# Patient Record
Sex: Female | Born: 1955
Health system: Southern US, Community
[De-identification: ages and names within clinical notes are randomized; demographics above are authoritative.]

---

## 1998-10-05 ENCOUNTER — Other Ambulatory Visit: Admission: RE | Admit: 1998-10-05 | Discharge: 1998-10-05 | Payer: Self-pay | Admitting: Nurse Practitioner

## 2001-10-26 ENCOUNTER — Other Ambulatory Visit: Admission: RE | Admit: 2001-10-26 | Discharge: 2001-10-26 | Payer: Self-pay | Admitting: Obstetrics & Gynecology

## 2002-05-01 ENCOUNTER — Encounter: Payer: Self-pay | Admitting: Internal Medicine

## 2002-05-01 ENCOUNTER — Encounter: Admission: RE | Admit: 2002-05-01 | Discharge: 2002-05-01 | Payer: Self-pay | Admitting: Internal Medicine

## 2002-05-06 ENCOUNTER — Other Ambulatory Visit: Admission: RE | Admit: 2002-05-06 | Discharge: 2002-05-06 | Payer: Self-pay | Admitting: Internal Medicine

## 2003-01-09 ENCOUNTER — Other Ambulatory Visit: Admission: RE | Admit: 2003-01-09 | Discharge: 2003-01-09 | Payer: Self-pay | Admitting: Obstetrics & Gynecology

## 2004-03-31 ENCOUNTER — Other Ambulatory Visit: Admission: RE | Admit: 2004-03-31 | Discharge: 2004-03-31 | Payer: Self-pay | Admitting: Obstetrics & Gynecology

## 2004-05-26 ENCOUNTER — Ambulatory Visit: Payer: Self-pay | Admitting: Internal Medicine

## 2004-07-22 ENCOUNTER — Ambulatory Visit: Payer: Self-pay | Admitting: Internal Medicine

## 2005-09-21 ENCOUNTER — Ambulatory Visit: Payer: Self-pay | Admitting: Internal Medicine

## 2005-09-27 ENCOUNTER — Encounter: Payer: Self-pay | Admitting: Internal Medicine

## 2005-09-27 ENCOUNTER — Ambulatory Visit: Payer: Self-pay | Admitting: Internal Medicine

## 2005-09-27 ENCOUNTER — Other Ambulatory Visit: Admission: RE | Admit: 2005-09-27 | Discharge: 2005-09-27 | Payer: Self-pay | Admitting: Internal Medicine

## 2005-10-03 ENCOUNTER — Encounter: Admission: RE | Admit: 2005-10-03 | Discharge: 2005-10-03 | Payer: Self-pay | Admitting: Internal Medicine

## 2006-10-26 ENCOUNTER — Ambulatory Visit: Payer: Self-pay | Admitting: Internal Medicine

## 2006-10-26 LAB — CONVERTED CEMR LAB
AST: 30 units/L (ref 0–37)
Albumin: 3.8 g/dL (ref 3.5–5.2)
Bacteria, UA: NEGATIVE
Basophils Absolute: 0 10*3/uL (ref 0.0–0.1)
Bilirubin Urine: NEGATIVE
Chloride: 106 meq/L (ref 96–112)
Cholesterol: 182 mg/dL (ref 0–200)
GFR calc non Af Amer: 70 mL/min
HDL: 77.1 mg/dL (ref >39.0)
LDL Cholesterol: 97 mg/dL (ref 0–99)
MCHC: 35.4 g/dL (ref 30.0–36.0)
Monocytes Relative: 6.1 % (ref 3.0–11.0)
Nitrite: NEGATIVE
Platelets: 201 10*3/uL (ref 150–400)
RBC: 3.94 M/uL (ref 3.87–5.11)
RDW: 13.5 % (ref 11.5–14.6)
Sodium: 142 meq/L (ref 135–145)
Specific Gravity, Urine: 1.015 (ref 1.000–1.03)
TSH: 6.24 microintl units/mL — ABNORMAL HIGH (ref 0.35–5.50)
Total CHOL/HDL Ratio: 2.4
pH: 7 (ref 5.0–8.0)

## 2006-11-02 ENCOUNTER — Ambulatory Visit: Payer: Self-pay | Admitting: Internal Medicine

## 2006-11-02 ENCOUNTER — Other Ambulatory Visit: Admission: RE | Admit: 2006-11-02 | Discharge: 2006-11-02 | Payer: Self-pay | Admitting: Internal Medicine

## 2006-11-02 ENCOUNTER — Encounter: Payer: Self-pay | Admitting: Internal Medicine

## 2006-12-06 ENCOUNTER — Encounter: Admission: RE | Admit: 2006-12-06 | Discharge: 2006-12-06 | Payer: Self-pay | Admitting: Internal Medicine

## 2007-02-06 ENCOUNTER — Encounter: Payer: Self-pay | Admitting: *Deleted

## 2007-02-06 DIAGNOSIS — E039 Hypothyroidism, unspecified: Secondary | ICD-10-CM | POA: Insufficient documentation

## 2007-02-06 DIAGNOSIS — E538 Deficiency of other specified B group vitamins: Secondary | ICD-10-CM | POA: Insufficient documentation

## 2008-01-04 ENCOUNTER — Ambulatory Visit: Payer: Self-pay | Admitting: Internal Medicine

## 2008-01-04 ENCOUNTER — Telehealth: Payer: Self-pay | Admitting: Internal Medicine

## 2008-01-09 DIAGNOSIS — D649 Anemia, unspecified: Secondary | ICD-10-CM | POA: Insufficient documentation

## 2008-02-19 ENCOUNTER — Ambulatory Visit: Payer: Self-pay | Admitting: Internal Medicine

## 2008-02-19 LAB — CONVERTED CEMR LAB
Albumin: 3.7 g/dL (ref 3.5–5.2)
Alkaline Phosphatase: 98 units/L (ref 39–117)
BUN: 13 mg/dL (ref 6–23)
Bilirubin Urine: NEGATIVE
Bilirubin, Direct: 0.2 mg/dL (ref 0.0–0.3)
Calcium: 9.4 mg/dL (ref 8.4–10.5)
Cholesterol: 158 mg/dL (ref 0–200)
Eosinophils Absolute: 0.2 10*3/uL (ref 0.0–0.7)
GFR calc Af Amer: 97 mL/min
GFR calc non Af Amer: 80 mL/min
HCT: 39.3 % (ref 36.0–46.0)
HDL: 74.3 mg/dL (ref >39.0)
Ketones, ur: NEGATIVE mg/dL
MCV: 96.3 fL (ref 78.0–100.0)
Monocytes Absolute: 0.4 10*3/uL (ref 0.1–1.0)
Neutro Abs: 2.1 10*3/uL (ref 1.4–7.7)
Platelets: 220 10*3/uL (ref 150–400)
Potassium: 4.2 meq/L (ref 3.5–5.1)
RDW: 12.4 % (ref 11.5–14.6)
Sodium: 141 meq/L (ref 135–145)
Total Protein, Urine: NEGATIVE mg/dL
Triglycerides: 44 mg/dL (ref 0–149)
Urine Glucose: NEGATIVE mg/dL
VLDL: 9 mg/dL (ref 0–40)

## 2008-02-26 ENCOUNTER — Ambulatory Visit: Payer: Self-pay | Admitting: Internal Medicine

## 2008-02-29 ENCOUNTER — Encounter: Admission: RE | Admit: 2008-02-29 | Discharge: 2008-02-29 | Payer: Self-pay | Admitting: Internal Medicine

## 2008-11-18 ENCOUNTER — Ambulatory Visit: Payer: Self-pay | Admitting: Internal Medicine

## 2008-11-18 DIAGNOSIS — J069 Acute upper respiratory infection, unspecified: Secondary | ICD-10-CM | POA: Insufficient documentation

## 2008-11-18 DIAGNOSIS — L719 Rosacea, unspecified: Secondary | ICD-10-CM | POA: Insufficient documentation

## 2008-11-25 ENCOUNTER — Telehealth: Payer: Self-pay | Admitting: Internal Medicine

## 2008-11-27 ENCOUNTER — Ambulatory Visit: Payer: Self-pay | Admitting: Internal Medicine

## 2008-11-27 DIAGNOSIS — R091 Pleurisy: Secondary | ICD-10-CM | POA: Insufficient documentation

## 2008-11-27 DIAGNOSIS — J180 Bronchopneumonia, unspecified organism: Secondary | ICD-10-CM | POA: Insufficient documentation

## 2009-03-18 ENCOUNTER — Ambulatory Visit: Payer: Self-pay | Admitting: Internal Medicine

## 2009-03-19 LAB — CONVERTED CEMR LAB
Alkaline Phosphatase: 96 units/L (ref 39–117)
BUN: 13 mg/dL (ref 6–23)
Basophils Absolute: 0 10*3/uL (ref 0.0–0.1)
Basophils Relative: 0.6 % (ref 0.0–3.0)
Bilirubin, Direct: 0.1 mg/dL (ref 0.0–0.3)
CO2: 28 meq/L (ref 19–32)
Calcium: 9.3 mg/dL (ref 8.4–10.5)
Chloride: 108 meq/L (ref 96–112)
Cholesterol: 161 mg/dL (ref 0–200)
Creatinine, Ser: 0.9 mg/dL (ref 0.4–1.2)
Eosinophils Absolute: 0.1 10*3/uL (ref 0.0–0.7)
HDL: 72.1 mg/dL (ref >39.00)
Lymphocytes Relative: 45.2 % (ref 12.0–46.0)
MCHC: 34.6 g/dL (ref 30.0–36.0)
MCV: 95.8 fL (ref 78.0–100.0)
Monocytes Absolute: 0.3 10*3/uL (ref 0.1–1.0)
Neutrophils Relative %: 46 % (ref 43.0–77.0)
Nitrite: NEGATIVE
Platelets: 205 10*3/uL (ref 150.0–400.0)
RBC: 4.11 M/uL (ref 3.87–5.11)
RDW: 12.9 % (ref 11.5–14.6)
Specific Gravity, Urine: 1.02 (ref 1.000–1.030)
Total Bilirubin: 0.5 mg/dL (ref 0.3–1.2)
Total CHOL/HDL Ratio: 2
Total Protein, Urine: NEGATIVE mg/dL
Total Protein: 6.8 g/dL (ref 6.0–8.3)
Triglycerides: 49 mg/dL (ref 0.0–149.0)
Urine Glucose: NEGATIVE mg/dL
Vit D, 25-Hydroxy: 24 ng/mL — ABNORMAL LOW (ref 30–89)
pH: 5.5 (ref 5.0–8.0)

## 2009-03-25 ENCOUNTER — Other Ambulatory Visit: Admission: RE | Admit: 2009-03-25 | Discharge: 2009-03-25 | Payer: Self-pay | Admitting: Internal Medicine

## 2009-03-25 ENCOUNTER — Ambulatory Visit: Payer: Self-pay | Admitting: Internal Medicine

## 2009-03-25 ENCOUNTER — Encounter: Payer: Self-pay | Admitting: Internal Medicine

## 2009-03-25 DIAGNOSIS — J029 Acute pharyngitis, unspecified: Secondary | ICD-10-CM | POA: Insufficient documentation

## 2009-03-25 DIAGNOSIS — N760 Acute vaginitis: Secondary | ICD-10-CM | POA: Insufficient documentation

## 2009-03-25 DIAGNOSIS — L259 Unspecified contact dermatitis, unspecified cause: Secondary | ICD-10-CM | POA: Insufficient documentation

## 2009-03-25 DIAGNOSIS — D259 Leiomyoma of uterus, unspecified: Secondary | ICD-10-CM | POA: Insufficient documentation

## 2009-03-27 ENCOUNTER — Encounter: Admission: RE | Admit: 2009-03-27 | Discharge: 2009-03-27 | Payer: Self-pay | Admitting: Internal Medicine

## 2009-05-06 ENCOUNTER — Telehealth: Payer: Self-pay | Admitting: Internal Medicine

## 2009-08-10 ENCOUNTER — Encounter: Payer: Self-pay | Admitting: Internal Medicine

## 2010-04-20 ENCOUNTER — Encounter: Admission: RE | Admit: 2010-04-20 | Discharge: 2010-04-20 | Payer: Self-pay | Admitting: Internal Medicine

## 2010-06-01 ENCOUNTER — Encounter
Admission: RE | Admit: 2010-06-01 | Discharge: 2010-06-01 | Payer: Self-pay | Source: Home / Self Care | Attending: Internal Medicine | Admitting: Internal Medicine

## 2010-06-29 NOTE — Letter (Signed)
Summary: Referral - not able to see patient  [facility]  [address] [place], [place] [address]   Phone: [phone]  Fax: [phone]    August 10, 2009   Jacinta Shoe, M.D. [address] [place], [place] [address]   Re:   KAITLEN REDFORD DOB:  1955-06-12 MRN:   [id number]    Dear Dr. Posey Rea:  Thank you for your kind referral of the above patient.  We have attempted to schedule the recommended procedure Screening Colonoscopy but have not been able to schedule because:   X  The patient was not available by phone and/or has not returned our calls.  ___ The patient declined to schedule the procedure at this time.  We appreciate the referral and hope that we will have the opportunity to treat this patient in the future.    Sincerely,    [facility] [phone]     Appended Document: Referral - not able to see patient noted

## 2010-06-30 ENCOUNTER — Encounter: Payer: Self-pay | Admitting: Internal Medicine

## 2010-07-27 ENCOUNTER — Telehealth: Payer: Self-pay | Admitting: Internal Medicine

## 2010-08-05 NOTE — Progress Notes (Signed)
Summary: LAST OV 2010  Phone Note Refill Request Message from:  Pharmacy  Refills Requested: Medication #1:  SYNTHROID 88 MCG  TABS once daily Initial call taken by: Lamar Sprinkles, CMA,  July 27, 2010 7:07 PM  Follow-up for Phone Call        ok to fill x6 months  sch ov pls Follow-up by: Tresa Garter MD,  July 28, 2010 7:52 AM    Prescriptions: SYNTHROID 88 MCG  TABS (LEVOTHYROXINE SODIUM) once daily  #30 x 5   Entered by:   Lamar Sprinkles, CMA   Authorized by:   Tresa Garter MD   Signed by:   Lamar Sprinkles, CMA on 07/28/2010   Method used:   Electronically to        [facility]  [address] [id number]* (retail)       [address]       [place], [place]  [address]       Ph: [phone] or [phone]       Fax: [phone]   RxID:   [id number]

## 2010-10-12 NOTE — Assessment & Plan Note (Signed)
[facility]                           PRIMARY CARE OFFICE NOTE   NAME:Mikayla Spears, Mikayla Spears                       MRN:          [id number]  DATE:11/02/2006                            DOB:          [birth year]    The patient is a 55 year old female who presents for a wellness  examination and pap smear.  Past medical history, family history, and  social history as per 09/27/05 note.   ALLERGIES:  CODEINE and SULFA.   MEDICATIONS:  She ran out of birth control pills in January 2008, never  started back.  1. Calcium with vitamin D.  2. Synthroid 88 mcg.   REVIEW OF SYSTEMS:  Last menstrual period was four months ago. She saw  Dr. Arlyce Dice six weeks ago complaining of the absence of her period. She  was told that she is menopausal, does reports some night sweats. She is  exercising several times a week at the gym. No chest pain or shortness  of breath. No syncope. No neurologic complaints. The rest of the 18  point review of systems is negative.   PHYSICAL EXAMINATION:  VITAL SIGNS:  Blood pressure 122/81, pulse 56,  temperature 97, weight 180 pounds.  GENERAL:  She is in no acute distress and looks well.  HEENT:  Moist mucosa.  NECK:  Supple. No thyromegaly or bruit.  LUNGS:  Clear no wheezes or rales.  HEART:  S1, S2, no murmur, no gallop.  ABDOMEN:  Soft and nontender, no organomegaly or mass felt.  LOWER EXTREMITIES:  Without edema.  NEUROLOGIC:  She is alert, oriented and cooperative. Denies being  depressed.  BREAST:  Reveals symmetric nipples, normal breasts without masses.  No  axillary lymphadenopathy  Normal external genitalia, cervix visualized with normal appearance. Pap  smear obtained.  Bimanual examination is normal.  RECTAL:  Normal. Stool is guaiac negative.   LABORATORY DATA:  EKG with normal sinus rhythm, demonstrates sinus  bradycardia. On 10/26/06, CBC normal, glucose 100, cholesterol 182, LDL  97, HDL 77, urinalysis normal. TSH  slightly elevated.   ASSESSMENT AND PLAN:  1. Normal wellness examination.  Age-related health issues discussed,      lifestyle discussed. She will take calcium with vitamin D, bone      density scan ordered, will obtain chest x-ray next year.  2. Sweats likely due to menopause. As I understand,  she has had FSH      done with Dr. Arlyce Dice. She will follow up with him.  3. Hypothyroidism. Slightly elevated TSH, likely caused by the fact      that she ran out of      Synthroid for a couple of weeks.  4. B12 deficiency. We will recheck vitamin B12 level.     [name], MD  Electronically Signed    AVP/MedQ  DD: 11/05/2006  DT: 11/06/2006  Job #: [id number]   cc:   Ilda Mori, M.D.

## 2011-06-06 ENCOUNTER — Other Ambulatory Visit: Payer: Self-pay | Admitting: Internal Medicine

## 2011-06-06 DIAGNOSIS — Z1231 Encounter for screening mammogram for malignant neoplasm of breast: Secondary | ICD-10-CM

## 2011-06-22 ENCOUNTER — Ambulatory Visit: Payer: Self-pay

## 2011-06-27 ENCOUNTER — Ambulatory Visit
Admission: RE | Admit: 2011-06-27 | Discharge: 2011-06-27 | Disposition: A | Discharge status: Home / Self Care | Payer: BC Managed Care – PPO | Source: Ambulatory Visit | Attending: Internal Medicine | Admitting: Internal Medicine

## 2011-06-27 DIAGNOSIS — Z1231 Encounter for screening mammogram for malignant neoplasm of breast: Secondary | ICD-10-CM

## 2012-06-26 ENCOUNTER — Other Ambulatory Visit: Payer: Self-pay | Admitting: Internal Medicine

## 2012-06-26 DIAGNOSIS — Z1231 Encounter for screening mammogram for malignant neoplasm of breast: Secondary | ICD-10-CM

## 2012-07-11 ENCOUNTER — Ambulatory Visit
Admission: RE | Admit: 2012-07-11 | Discharge: 2012-07-11 | Disposition: A | Discharge status: Home / Self Care | Payer: BC Managed Care – PPO | Source: Ambulatory Visit | Attending: Internal Medicine | Admitting: Internal Medicine

## 2012-07-11 ENCOUNTER — Ambulatory Visit: Payer: BC Managed Care – PPO

## 2012-07-11 DIAGNOSIS — Z1231 Encounter for screening mammogram for malignant neoplasm of breast: Secondary | ICD-10-CM

## 2013-07-08 ENCOUNTER — Other Ambulatory Visit: Payer: Self-pay

## 2013-07-08 DIAGNOSIS — Z1231 Encounter for screening mammogram for malignant neoplasm of breast: Secondary | ICD-10-CM

## 2013-07-24 ENCOUNTER — Ambulatory Visit
Admission: RE | Admit: 2013-07-24 | Discharge: 2013-07-24 | Disposition: A | Discharge status: Home / Self Care | Payer: BC Managed Care – PPO | Source: Ambulatory Visit

## 2013-07-24 DIAGNOSIS — Z1231 Encounter for screening mammogram for malignant neoplasm of breast: Secondary | ICD-10-CM

## 2014-07-11 ENCOUNTER — Other Ambulatory Visit: Payer: Self-pay

## 2014-07-11 DIAGNOSIS — Z1231 Encounter for screening mammogram for malignant neoplasm of breast: Secondary | ICD-10-CM

## 2014-07-25 ENCOUNTER — Ambulatory Visit
Admission: RE | Admit: 2014-07-25 | Discharge: 2014-07-25 | Disposition: A | Discharge status: Home / Self Care | Payer: BLUE CROSS/BLUE SHIELD | Source: Ambulatory Visit

## 2014-07-25 ENCOUNTER — Encounter (INDEPENDENT_AMBULATORY_CARE_PROVIDER_SITE_OTHER): Payer: Self-pay

## 2014-07-25 DIAGNOSIS — Z1231 Encounter for screening mammogram for malignant neoplasm of breast: Secondary | ICD-10-CM

## 2015-07-22 ENCOUNTER — Other Ambulatory Visit: Payer: Self-pay

## 2015-07-22 DIAGNOSIS — Z1231 Encounter for screening mammogram for malignant neoplasm of breast: Secondary | ICD-10-CM

## 2015-08-03 ENCOUNTER — Ambulatory Visit
Admission: RE | Admit: 2015-08-03 | Discharge: 2015-08-03 | Disposition: A | Discharge status: Home / Self Care | Payer: 59 | Source: Ambulatory Visit

## 2015-08-03 DIAGNOSIS — Z1231 Encounter for screening mammogram for malignant neoplasm of breast: Secondary | ICD-10-CM

## 2015-08-27 ENCOUNTER — Other Ambulatory Visit: Payer: Self-pay | Admitting: Obstetrics & Gynecology

## 2015-08-28 LAB — CYTOLOGY - PAP

## 2016-06-01 ENCOUNTER — Other Ambulatory Visit: Payer: Self-pay | Admitting: Gastroenterology

## 2016-06-24 DIAGNOSIS — J101 Influenza due to other identified influenza virus with other respiratory manifestations: Secondary | ICD-10-CM | POA: Diagnosis not present

## 2016-06-30 ENCOUNTER — Other Ambulatory Visit: Payer: Self-pay | Admitting: Internal Medicine

## 2016-06-30 DIAGNOSIS — Z1231 Encounter for screening mammogram for malignant neoplasm of breast: Secondary | ICD-10-CM

## 2016-07-12 DIAGNOSIS — H52223 Regular astigmatism, bilateral: Secondary | ICD-10-CM | POA: Diagnosis not present

## 2016-07-12 DIAGNOSIS — H5203 Hypermetropia, bilateral: Secondary | ICD-10-CM | POA: Diagnosis not present

## 2016-07-12 DIAGNOSIS — H25813 Combined forms of age-related cataract, bilateral: Secondary | ICD-10-CM | POA: Diagnosis not present

## 2016-07-12 DIAGNOSIS — H40023 Open angle with borderline findings, high risk, bilateral: Secondary | ICD-10-CM | POA: Diagnosis not present

## 2016-07-12 DIAGNOSIS — H524 Presbyopia: Secondary | ICD-10-CM | POA: Diagnosis not present

## 2016-07-29 ENCOUNTER — Ambulatory Visit (HOSPITAL_COMMUNITY)
Admission: RE | Admit: 2016-07-29 | Discharge: 2016-07-29 | Disposition: A | Discharge status: Home / Self Care | Payer: BLUE CROSS/BLUE SHIELD | Source: Ambulatory Visit | Attending: Gastroenterology | Admitting: Gastroenterology

## 2016-07-29 ENCOUNTER — Ambulatory Visit (HOSPITAL_COMMUNITY): Payer: BLUE CROSS/BLUE SHIELD | Admitting: Anesthesiology

## 2016-07-29 ENCOUNTER — Encounter (HOSPITAL_COMMUNITY): Payer: Self-pay | Admitting: *Deleted

## 2016-07-29 ENCOUNTER — Encounter (HOSPITAL_COMMUNITY)
Admission: RE | Disposition: A | Discharge status: Home / Self Care | Payer: Self-pay | Source: Ambulatory Visit | Attending: Gastroenterology

## 2016-07-29 DIAGNOSIS — Z882 Allergy status to sulfonamides status: Secondary | ICD-10-CM | POA: Insufficient documentation

## 2016-07-29 DIAGNOSIS — K317 Polyp of stomach and duodenum: Secondary | ICD-10-CM | POA: Diagnosis not present

## 2016-07-29 DIAGNOSIS — K2950 Unspecified chronic gastritis without bleeding: Secondary | ICD-10-CM | POA: Insufficient documentation

## 2016-07-29 DIAGNOSIS — K2940 Chronic atrophic gastritis without bleeding: Secondary | ICD-10-CM | POA: Diagnosis not present

## 2016-07-29 DIAGNOSIS — E039 Hypothyroidism, unspecified: Secondary | ICD-10-CM | POA: Insufficient documentation

## 2016-07-29 DIAGNOSIS — D3A092 Benign carcinoid tumor of the stomach: Secondary | ICD-10-CM | POA: Diagnosis not present

## 2016-07-29 DIAGNOSIS — D649 Anemia, unspecified: Secondary | ICD-10-CM | POA: Diagnosis not present

## 2016-07-29 HISTORY — PX: EUS: SHX5427

## 2016-07-29 SURGERY — UPPER ENDOSCOPIC ULTRASOUND (EUS) LINEAR
Anesthesia: Monitor Anesthesia Care

## 2016-07-29 MED ORDER — LIDOCAINE 2% (20 MG/ML) 5 ML SYRINGE
INTRAMUSCULAR | Status: AC
Start: 2016-07-29 — End: 2016-07-29
  Filled 2016-07-29: qty 5

## 2016-07-29 MED ORDER — SODIUM CHLORIDE 0.9 % IV SOLN: INTRAVENOUS | Status: DC

## 2016-07-29 MED ORDER — PROPOFOL 10 MG/ML IV BOLUS
INTRAVENOUS | Status: AC
  Filled 2016-07-29: qty 20

## 2016-07-29 MED ORDER — LIDOCAINE 2% (20 MG/ML) 5 ML SYRINGE
INTRAMUSCULAR | Status: DC | PRN
  Administered 2016-07-29: 100 mg via INTRAVENOUS

## 2016-07-29 MED ORDER — PROPOFOL 10 MG/ML IV BOLUS
INTRAVENOUS | Status: DC | PRN
  Administered 2016-07-29: 30 mg via INTRAVENOUS
  Administered 2016-07-29 (×4): 50 mg via INTRAVENOUS

## 2016-07-29 MED ORDER — LACTATED RINGERS IV SOLN
INTRAVENOUS | Status: DC
  Administered 2016-07-29: 1000 mL via INTRAVENOUS

## 2016-07-29 NOTE — Op Note (Signed)
[facility] Patient Name: Mikayla Spears Procedure Date: 07/29/2016 MRN: [id number] Attending MD: Carol Ada , MD Date of Birth: 1955-12-23 CSN: [id number] Age: 61 Admit Type: Outpatient Procedure:                Upper EUS Indications:              Gastric mucosal mass/polyp found on endoscopy Providers:                Carol Ada, MD, Laverta Baltimore RN, RN, Cherylynn Ridges, Technician, [facility], CRNA Referring MD:              Medicines:                Propofol per Anesthesia Complications:            No immediate complications. Estimated Blood Loss:     Estimated blood loss was minimal. Procedure:                Pre-Anesthesia Assessment:                           - Prior to the procedure, a History and Physical                            was performed, and patient medications and                            allergies were reviewed. The patient's tolerance of                            previous anesthesia was also reviewed. The risks                            and benefits of the procedure and the sedation                            options and risks were discussed with the patient.                            All questions were answered, and informed consent                            was obtained. Prior Anticoagulants: The patient has                            taken no previous anticoagulant or antiplatelet                            agents. ASA Grade Assessment: II - A patient with                            mild systemic disease. After reviewing the risks  and benefits, the patient was deemed in                            satisfactory condition to undergo the procedure.                           - Sedation was administered by an anesthesia                            professional. Deep sedation was attained.                           After obtaining informed consent, the endoscope was                             passed under direct vision. Throughout the                            procedure, the patient's blood pressure, pulse, and                            oxygen saturations were monitored continuously. The                            [id number]) scope was introduced through                            the mouth, and advanced to the second part of                            duodenum. The [id number]) scope was                            introduced through the mouth, and advanced to the                            second part of duodenum. The upper EUS was                            accomplished without difficulty. The patient                            tolerated the procedure well. Scope In: Scope Out: Findings:      Endoscopic Finding :      The examined esophagus was endoscopically normal.      Four 3 to 5 mm sessile polyps with no bleeding and no stigmata of recent       bleeding were found in the cardia and in the gastric fundus. The polyp       was removed with a hot snare and The polyp was removed with a cold       snare. Resection and retrieval were complete.      The examined duodenum was endoscopically normal.      Endosonographic Finding :      Visualized portions of the left lobe of  the liver, left adrenal gland,       left kidney and pancreas were normal on ultrasound examination. No       abnormal-appearing lymph nodes were identified in the abdomen.      The gastric mucosa exhibited an atrophic gastritis. In the fundus a       couple of small sessile polypoid lesions were identified and they       correlated with the prior findings of the index EGD. Since the pathology       was significant for a gastric carcinoid, these two lesions were removed       with a hot snare. At the cardia, just below the GE junction, two other       sessile lesions were identified. It is uncertain if she has a multifocal       gastric carcinoid and a cold snare was used to  removed these two       lesions. I opted for a cold snare at the histology was unknown and it       was in close proximity to the GE junction. Impression:               - Normal esophagus.                           - Four gastric polyps. Resected and retrieved.                           - Normal examined duodenum. Moderate Sedation:      N/A- Per Anesthesia Care Recommendation:           - Patient has a contact number available for                            emergencies. The signs and symptoms of potential                            delayed complications were discussed with the                            patient. Return to normal activities tomorrow.                            Written discharge instructions were provided to the                            patient.                           - Await biopsy results.                           - Resume previous diet. Procedure Code(s):        --- Professional ---                           [phone], Esophagogastroduodenoscopy, flexible,                            transoral; with removal of tumor(s), polyp(s),  or                            other lesion(s) by snare technique                           43237, Esophagogastroduodenoscopy, flexible,                            transoral; with endoscopic ultrasound examination                            limited to the esophagus, stomach or duodenum, and                            adjacent structures Diagnosis Code(s):        --- Professional ---                           K31.7, Polyp of stomach and duodenum                           K31.89, Other diseases of stomach and duodenum CPT copyright 2016 American Medical Association. All rights reserved. The codes documented in this report are preliminary and upon coder review may  be revised to meet current compliance requirements. Carol Ada, MD Carol Ada, MD 07/29/2016 9:37:57 AM This report has been signed electronically. Number of Addenda: 0

## 2016-07-29 NOTE — Progress Notes (Signed)
Pt preop heart rate 65, immediately postop  In 50's . bp normal see chart. HR now in 40's. Pt without symptoms. bp still normal. Dr. Kalman Shan made aware of HR in 40's. No new orders and fine with pts status to discharge

## 2016-07-29 NOTE — Progress Notes (Signed)
Dr. Kalman Shan called regarding two bp 206/73. And 37-40's. No new orders pt denies any symprtoms and does not feel bad.  Dr. Kalman Shan feels fine to discharge her home. Told pt to follow up and have bp checked at home. And if she gets any symptoms headache dizzness or feels bad to call doctor or come in

## 2016-07-29 NOTE — Discharge Instructions (Signed)

## 2016-07-29 NOTE — Transfer of Care (Signed)
Immediate Anesthesia Transfer of Care Note  Patient: Mikayla Spears  Procedure(s) Performed: Procedure(s): UPPER ENDOSCOPIC ULTRASOUND (EUS) LINEAR (N/A)  Patient Location: PACU  Anesthesia Type:MAC  Level of Consciousness: awake, alert  and oriented  Airway & Oxygen Therapy: Patient Spontanous Breathing  Post-op Assessment: Report given to RN and Post -op Vital signs reviewed and stable  Post vital signs: Reviewed and stable  Last Vitals:  Vitals:   07/29/16 0804  BP: (!) 163/85  Pulse: 65  Resp: 20  Temp: 36.8 C    Last Pain:  Vitals:   07/29/16 0804  TempSrc: Oral         Complications: No apparent anesthesia complications

## 2016-07-29 NOTE — Anesthesia Postprocedure Evaluation (Signed)
Anesthesia Post Note  Patient: Mikayla Spears  Procedure(s) Performed: Procedure(s) (LRB): UPPER ENDOSCOPIC ULTRASOUND (EUS) LINEAR (N/A)  Patient location during evaluation: PACU Anesthesia Type: MAC Level of consciousness: awake and alert Pain management: pain level controlled Vital Signs Assessment: post-procedure vital signs reviewed and stable Respiratory status: spontaneous breathing, nonlabored ventilation, respiratory function stable and patient connected to nasal cannula oxygen Cardiovascular status: stable and blood pressure returned to baseline Anesthetic complications: no       Last Vitals:  Vitals:   07/29/16 0955 07/29/16 1000  BP:    Pulse: (!) 51 (!) 48  Resp: 11 19  Temp:      Last Pain:  Vitals:   07/29/16 0937  TempSrc: Oral                 Gordy Goar S

## 2016-07-29 NOTE — Anesthesia Preprocedure Evaluation (Signed)
Anesthesia Evaluation  Patient identified by MRN, date of birth, ID band Patient awake    Reviewed: Allergy & Precautions, NPO status , Patient's Chart, lab work & pertinent test results  Airway Mallampati: II  TM Distance: >3 FB Neck ROM: Full    Dental no notable dental hx.    Pulmonary neg pulmonary ROS,    Pulmonary exam normal breath sounds clear to auscultation       Cardiovascular negative cardio ROS Normal cardiovascular exam Rhythm:Regular Rate:Normal     Neuro/Psych negative neurological ROS  negative psych ROS   GI/Hepatic negative GI ROS, Neg liver ROS,   Endo/Other  Hypothyroidism   Renal/GU negative Renal ROS  negative genitourinary   Musculoskeletal negative musculoskeletal ROS (+)   Abdominal   Peds negative pediatric ROS (+)  Hematology negative hematology ROS (+)   Anesthesia Other Findings   Reproductive/Obstetrics negative OB ROS                             Anesthesia Physical Anesthesia Plan  ASA: II  Anesthesia Plan: MAC   Post-op Pain Management:    Induction: Intravenous  Airway Management Planned: Nasal Cannula  Additional Equipment:   Intra-op Plan:   Post-operative Plan:   Informed Consent: I have reviewed the patients History and Physical, chart, labs and discussed the procedure including the risks, benefits and alternatives for the proposed anesthesia with the patient or authorized representative who has indicated his/her understanding and acceptance.   Dental advisory given  Plan Discussed with: CRNA and Surgeon  Anesthesia Plan Comments:         Anesthesia Quick Evaluation

## 2016-07-29 NOTE — H&P (Addendum)
Toni Amend HPI: This is a 61 year old female with a findings of a small gastric carcinoid in the fundus.  She is here for further evaluation and follow up.  The EGD was performed on 12/09/2015.  No past medical history on file.  No past surgical history on file.  No family history on file.  Social History:  has no tobacco, alcohol, and drug history on file.  Allergies:  Allergies  Allergen Reactions  . Doxycycline     REACTION: rash  and elev LFT  (?)  . Codeine Hives, Swelling and Rash  . Sulfonamide Derivatives Hives, Swelling and Rash    Medications:  Scheduled:  Continuous: . sodium chloride      No results found for this or any previous visit (from the past 24 hour(s)).   No results found.  ROS:  As stated above in the HPI otherwise negative.  There were no vitals taken for this visit.    PE: Gen: NAD, Alert and Oriented HEENT:  [place]/AT, EOMI Neck: Supple, no LAD Lungs: CTA Bilaterally CV: RRR without M/G/R ABM: Soft, NTND, +BS Ext: No C/C/E  Assessment/Plan: 1) Gastric carcinoid - EUS with repeat biopsies.  Terrick Allred D 07/29/2016, 7:57 AM

## 2016-08-01 ENCOUNTER — Encounter (HOSPITAL_COMMUNITY): Payer: Self-pay | Admitting: Gastroenterology

## 2016-08-16 ENCOUNTER — Ambulatory Visit
Admission: RE | Admit: 2016-08-16 | Discharge: 2016-08-16 | Disposition: A | Discharge status: Home / Self Care | Payer: BLUE CROSS/BLUE SHIELD | Source: Ambulatory Visit | Attending: Internal Medicine | Admitting: Internal Medicine

## 2016-08-16 DIAGNOSIS — Z1231 Encounter for screening mammogram for malignant neoplasm of breast: Secondary | ICD-10-CM

## 2016-10-05 DIAGNOSIS — Z0000 Encounter for general adult medical examination without abnormal findings: Secondary | ICD-10-CM | POA: Diagnosis not present

## 2016-10-12 DIAGNOSIS — E039 Hypothyroidism, unspecified: Secondary | ICD-10-CM | POA: Diagnosis not present

## 2016-10-12 DIAGNOSIS — L719 Rosacea, unspecified: Secondary | ICD-10-CM | POA: Diagnosis not present

## 2016-10-12 DIAGNOSIS — Z131 Encounter for screening for diabetes mellitus: Secondary | ICD-10-CM | POA: Diagnosis not present

## 2016-10-12 DIAGNOSIS — Z0000 Encounter for general adult medical examination without abnormal findings: Secondary | ICD-10-CM | POA: Diagnosis not present

## 2016-10-12 DIAGNOSIS — M8589 Other specified disorders of bone density and structure, multiple sites: Secondary | ICD-10-CM | POA: Diagnosis not present

## 2016-10-12 DIAGNOSIS — R002 Palpitations: Secondary | ICD-10-CM | POA: Diagnosis not present

## 2016-10-31 NOTE — Anesthesia Postprocedure Evaluation (Signed)
Anesthesia Post Note  Patient: Mikayla Spears  Procedure(s) Performed: Procedure(s) (LRB): UPPER ENDOSCOPIC ULTRASOUND (EUS) LINEAR (N/A)     Anesthesia Post Evaluation  Last Vitals:  Vitals:   07/29/16 1035 07/29/16 1040  BP:    Pulse: (!) 49 (!) 42  Resp: 14 (!) 9  Temp:      Last Pain:  Vitals:   07/29/16 0937  TempSrc: Oral                 [name] S

## 2016-10-31 NOTE — Addendum Note (Signed)
Addendum  created 10/31/16 1151 by Myrtie Soman, MD   Sign clinical note

## 2017-02-14 DIAGNOSIS — M5416 Radiculopathy, lumbar region: Secondary | ICD-10-CM | POA: Diagnosis not present

## 2017-03-14 DIAGNOSIS — J069 Acute upper respiratory infection, unspecified: Secondary | ICD-10-CM | POA: Diagnosis not present

## 2017-03-16 DIAGNOSIS — M4726 Other spondylosis with radiculopathy, lumbar region: Secondary | ICD-10-CM | POA: Diagnosis not present

## 2017-03-16 DIAGNOSIS — M79671 Pain in right foot: Secondary | ICD-10-CM | POA: Diagnosis not present

## 2017-03-16 DIAGNOSIS — M549 Dorsalgia, unspecified: Secondary | ICD-10-CM | POA: Diagnosis not present

## 2017-03-22 ENCOUNTER — Ambulatory Visit: Payer: BLUE CROSS/BLUE SHIELD | Attending: Orthopaedic Surgery | Admitting: Physical Therapy

## 2017-03-22 ENCOUNTER — Encounter: Payer: Self-pay | Admitting: Physical Therapy

## 2017-03-22 DIAGNOSIS — M79671 Pain in right foot: Secondary | ICD-10-CM

## 2017-03-22 DIAGNOSIS — R262 Difficulty in walking, not elsewhere classified: Secondary | ICD-10-CM | POA: Diagnosis not present

## 2017-03-22 NOTE — Therapy (Signed)
[facility] [address] [place], [place], [address] Phone: [phone]   Fax:  [phone]  Physical Therapy Evaluation  Patient Details  Name: Mikayla Spears MRN: [id number] Date of Birth: March 24, 1956 Referring Provider: Patrice Paradise  Encounter Date: 03/22/2017      PT End of Session - 03/22/17 1544    Visit Number 1   Date for PT Re-Evaluation 05/22/17   PT Start Time 1435   PT Stop Time 1530   PT Time Calculation (min) 55 min   Activity Tolerance Patient tolerated treatment well   Behavior During Therapy [facility] for tasks assessed/performed      History reviewed. No pertinent past medical history.  Past Surgical History:  Procedure Laterality Date  . EUS N/A 07/29/2016   Procedure: UPPER ENDOSCOPIC ULTRASOUND (EUS) LINEAR;  Surgeon: Carol Ada, MD;  Location: [facility];  Service: Endoscopy;  Laterality: N/A;    There were no vitals filed for this visit.       Subjective Assessment - 03/22/17 1442    Subjective Patient reports that about a month ago she had some back pain, then that overall has felt better but now has numbness and pain in the right heel.   Limitations Lifting;Walking   Patient Stated Goals have less pain, walk easier   Currently in Pain? Yes   Pain Score 6    Pain Location Heel   Pain Orientation Right   Pain Descriptors / Indicators Aching;Numbness   Pain Type Acute pain   Pain Onset More than a month ago   Pain Frequency Intermittent   Aggravating Factors  pain in the heel is mostly with DF, sharp pain in the plantar and medial aspect of the heel   Pain Relieving Factors at rest and off of feet the pain can be 0/10            [facility] PT Assessment - 03/22/17 0001      Assessment   Medical Diagnosis right heel pain   Referring Provider Cohen   Onset Date/Surgical Date 02/20/17   Prior Therapy no     Precautions   Precautions None     Balance Screen   Has the patient fallen in  the past 6 months No   Has the patient had a decrease in activity level because of a fear of falling?  No   Is the patient reluctant to leave their home because of a fear of falling?  No     Home Environment   Additional Comments has stairs, does the cooking, some cleaning, cares for grandkids at times     Prior Function   Level of Independence Independent   Vocation Retired   Leisure no exercise     ROM / Strength   AROM / PROM / Strength AROM;Strength     AROM   Overall AROM Comments lumbar ROM was WFL's, but reports that has some right low back pain, anle ROM WFL's except DF is 5 degrees iwth pain inthe heel     Strength   Overall Strength Comments WNL's for LE's and ankle     Flexibility   Soft Tissue Assessment /Muscle Length --  very tight in the calves and plantar fascia     Palpation   Palpation comment mild tendernessin the right SI, she is very tender in the right plantar fascia and the plantar and medial heel     Ambulation/Gait   Gait Comments mild antalgic gait on the  right with decreased toe off            Objective measurements completed on examination: See above findings.          [facility] PT Treatment/Exercise - 03/22/17 0001      Modalities   Modalities Electrical Stimulation;Moist Heat;Ultrasound                PT Education - 03/22/17 1544    Education provided Yes   Education Details gave HEP for calf and plantar flexibility   Person(s) Educated Patient   Methods Explanation;Demonstration;Tactile cues;Verbal cues;Handout   Comprehension Verbalized understanding;Returned demonstration          PT Short Term Goals - 03/22/17 1548      PT SHORT TERM GOAL #1   Title reports 50% less pain   Time 8   Period Weeks   Status New     PT SHORT TERM GOAL #2   Title increase DF to WNL's   Time 8   Period Weeks   Status New     PT SHORT TERM GOAL #3   Title report no problems walking up and down stairs   Time 8   Period  Weeks   Status New                   Plan - 03/22/17 1545    Clinical Impression Statement Patient reports that her issue started with back pain but now she has mostly right foot and heel pain with some numbness.  Lumbar ROM was WFL's, right ankle ROM was WNL's except DF was to 5 degrees with pain int he heel with this.  Any active DF and toe extension seemed to make this pain worse.  She had mild tenderness in the right SI area, she was very tender in the right plantar fascia and the heel.   Clinical Presentation Stable   Clinical Decision Making Low   Rehab Potential Good   PT Frequency 2x / week   PT Duration 8 weeks   PT Treatment/Interventions ADLs/Self Care Home Management;Cryotherapy;Electrical Stimulation;Iontophoresis 4mg /ml Dexamethasone;Moist Heat;Ultrasound;Gait training;Stair training;Functional mobility training;Patient/family education;Balance training;Therapeutic exercise;Therapeutic activities;Manual techniques;Taping   PT Next Visit Plan could try any modalities for plantar fascitis, will continue to look at back, assure her ability to do the HEP for calf and foot flexibility, sh has a high co insurance   Consulted and Agree with Plan of Care Patient      Patient will benefit from skilled therapeutic intervention in order to improve the following deficits and impairments:  Abnormal gait, Decreased balance, Postural dysfunction, Improper body mechanics, Impaired flexibility, Pain, Increased muscle spasms, Difficulty walking, Decreased range of motion  Visit Diagnosis: Pain in right foot - Plan: PT plan of care cert/re-cert  Difficulty in walking, not elsewhere classified - Plan: PT plan of care cert/re-cert      G-Codes - 17/49/44 1601    Functional Assessment Tool Used (Outpatient Only) foto 44% limitation   Functional Limitation Mobility: Walking and moving around   Mobility: Walking and Moving Around Current Status [phone]) At least 40 percent but less than  60 percent impaired, limited or restricted   Mobility: Walking and Moving Around Goal Status [phone]) At least 20 percent but less than 40 percent impaired, limited or restricted       Problem List Patient Active Problem List   Diagnosis Date Noted  . FIBROIDS, UTERUS 03/25/2009  . SORE THROAT 03/25/2009  . VAGINITIS 03/25/2009  . DERMATITIS, ALLERGIC  03/25/2009  . BRONCHOPNEUMONIA 11/27/2008  . PLEURISY 11/27/2008  . UPPER RESPIRATORY INFECTION (URI) 11/18/2008  . ROSACEA 11/18/2008  . ANEMIA-NOS 01/09/2008  . HYPOTHYROIDISM 02/06/2007  . VITAMIN B12 DEFICIENCY 02/06/2007    Sumner Boast., PT 03/22/2017, 4:03 PM  [address] [place], [place], [address] Phone: [phone]   Fax:  [phone]  Name: Mikayla Spears MRN: [id number] Date of Birth: 09/16/1955

## 2017-03-29 ENCOUNTER — Ambulatory Visit: Payer: BLUE CROSS/BLUE SHIELD | Admitting: Physical Therapy

## 2017-03-29 ENCOUNTER — Encounter: Payer: Self-pay | Admitting: Physical Therapy

## 2017-03-29 DIAGNOSIS — R262 Difficulty in walking, not elsewhere classified: Secondary | ICD-10-CM | POA: Diagnosis not present

## 2017-03-29 DIAGNOSIS — M79671 Pain in right foot: Secondary | ICD-10-CM | POA: Diagnosis not present

## 2017-03-29 NOTE — Therapy (Signed)
[facility] [place], [place], [address] Phone: [phone]   Fax:  [phone]  Physical Therapy Treatment  Patient Details  Name: Mikayla Spears MRN: [id number] Date of Birth: 08-May-1956 Referring Provider: Patrice Paradise  Encounter Date: 03/29/2017      PT End of Session - 03/29/17 1426    Visit Number 2   PT Start Time 8756   PT Stop Time 1435   PT Time Calculation (min) 50 min   Activity Tolerance Patient tolerated treatment well   Behavior During Therapy [facility] for tasks assessed/performed      History reviewed. No pertinent past medical history.  Past Surgical History:  Procedure Laterality Date  . EUS N/A 07/29/2016   Procedure: UPPER ENDOSCOPIC ULTRASOUND (EUS) LINEAR;  Surgeon: Carol Ada, MD;  Location: [facility];  Service: Endoscopy;  Laterality: N/A;    There were no vitals filed for this visit.      Subjective Assessment - 03/29/17 1349    Subjective Pt reports that her biggest issue is her foot, Her back bothers her after prolong standing   Currently in Pain? No/denies   Pain Score 0-No pain                         [facility] Adult PT Treatment/Exercise - 03/29/17 0001      Exercises   Exercises Knee/Hip;Ankle;Lumbar     Lumbar Exercises: Machines for Strengthening   Other Lumbar Machine Exercise Rows & lats 20lb 2x10     Lumbar Exercises: Standing   Shoulder Extension 10 reps;Theraband;Both  x2   Theraband Level (Shoulder Extension) Level 2 (Red)     Knee/Hip Exercises: Aerobic   Recumbent Bike L1 x 6 min     Knee/Hip Exercises: Machines for Strengthening   Cybex Knee Flexion 20lb 2x15     Modalities   Modalities Cryotherapy     Cryotherapy   Number Minutes Cryotherapy 10 Minutes   Cryotherapy Location Ankle   Type of Cryotherapy Ice pack     Manual Therapy   Manual Therapy Passive ROM;Soft tissue mobilization   Manual therapy comments Some PROM taken to  end range and held.   Soft tissue mobilization Plantar area R foot, some tenderness in arch with palpation.   Passive ROM R ankle all directions     Ankle Exercises: Stretches   Gastroc Stretch 10 seconds;3 reps     Ankle Exercises: Seated   Other Seated Ankle Exercises 4 way ankle Tband x15 each                  PT Short Term Goals - 03/22/17 1548      PT SHORT TERM GOAL #1   Title reports 50% less pain   Time 8   Period Weeks   Status New     PT SHORT TERM GOAL #2   Title increase DF to WNL's   Time 8   Period Weeks   Status New     PT SHORT TERM GOAL #3   Title report no problems walking up and down stairs   Time 8   Period Weeks   Status New                  Plan - 03/29/17 1427    Clinical Impression Statement No issues reported with today's exercises. She does display some ankle weakness with ankle PRE's. Reports some pain in the arch of  her R food with palpation.    Rehab Potential Good   PT Frequency 2x / week   PT Duration 8 weeks   PT Treatment/Interventions ADLs/Self Care Home Management;Cryotherapy;Electrical Stimulation;Iontophoresis 4mg /ml Dexamethasone;Moist Heat;Ultrasound;Gait training;Stair training;Functional mobility training;Patient/family education;Balance training;Therapeutic exercise;Therapeutic activities;Manual techniques;Taping   PT Next Visit Plan could try any modalities for plantar fascitis, will continue to look at back, assure her ability to do the HEP for calf and foot flexibility, sh has a high co insurance      Patient will benefit from skilled therapeutic intervention in order to improve the following deficits and impairments:  Abnormal gait, Decreased balance, Postural dysfunction, Improper body mechanics, Impaired flexibility, Pain, Increased muscle spasms, Difficulty walking, Decreased range of motion  Visit Diagnosis: Pain in right foot  Difficulty in walking, not elsewhere classified     Problem  List Patient Active Problem List   Diagnosis Date Noted  . FIBROIDS, UTERUS 03/25/2009  . SORE THROAT 03/25/2009  . VAGINITIS 03/25/2009  . DERMATITIS, ALLERGIC 03/25/2009  . BRONCHOPNEUMONIA 11/27/2008  . PLEURISY 11/27/2008  . UPPER RESPIRATORY INFECTION (URI) 11/18/2008  . ROSACEA 11/18/2008  . ANEMIA-NOS 01/09/2008  . HYPOTHYROIDISM 02/06/2007  . VITAMIN B12 DEFICIENCY 02/06/2007    Scot Jun, PTA 03/29/2017, 2:28 PM  [facility] [place], [place], [address] Phone: [phone]   Fax:  [phone]  Name: Mikayla Spears MRN: [id number] Date of Birth: [birth year]

## 2017-04-05 ENCOUNTER — Ambulatory Visit: Payer: BLUE CROSS/BLUE SHIELD | Admitting: Physical Therapy

## 2017-04-07 ENCOUNTER — Ambulatory Visit: Payer: BLUE CROSS/BLUE SHIELD | Attending: Orthopaedic Surgery | Admitting: Physical Therapy

## 2017-04-07 DIAGNOSIS — M79671 Pain in right foot: Secondary | ICD-10-CM

## 2017-04-07 DIAGNOSIS — R262 Difficulty in walking, not elsewhere classified: Secondary | ICD-10-CM | POA: Diagnosis not present

## 2017-04-07 NOTE — Therapy (Addendum)
[address] [place], [place], [address] Phone: [phone]   Fax:  [phone]  Physical Therapy Treatment  Patient Details  Name: Mikayla Spears MRN: [id number] Date of Birth: 1955/12/16 Referring Provider: Patrice Paradise   Encounter Date: 04/07/2017  PT End of Session - 04/07/17 1128    Visit Number  3    Date for PT Re-Evaluation  05/22/17    PT Start Time  1100    PT Stop Time  1145    PT Time Calculation (min)  45 min       No past medical history on file.  No past surgical history on file.  There were no vitals filed for this visit.  Subjective Assessment - 04/07/17 1058    Subjective  getting better    Currently in Pain?  Yes    Pain Score  4     Pain Location  Foot    Pain Orientation  Right         [facility] PT Assessment - 04/07/17 0001      AROM   Overall AROM Comments  DF 15                  [facility] Adult PT Treatment/Exercise - 04/07/17 0001      Ambulation/Gait   Stairs  Yes    Stairs Assistance  7: Independent    Stair Management Technique  Alternating pattern    Number of Stairs  16    Gait Comments  no deficiets on level terrain      Lumbar Exercises: Aerobic   Stationary Bike  Nustep L 4 6 min      Lumbar Exercises: Machines for Strengthening   Cybex Lumbar Extension  black tband 2 sets 10    Leg Press  30# 2 sets 10 calf raises 30# 2 sets 10    Other Lumbar Machine Exercise  Rows & lats 20lb 2x10      Lumbar Exercises: Standing   Other Standing Lumbar Exercises  pulleys retraction and ext 15 times each      Knee/Hip Exercises: Standing   Other Standing Knee Exercises  SLS on sit fit ankle ROM 4 way 15 each    Other Standing Knee Exercises  black bar DF 15/PF 15      Cryotherapy   Number Minutes Cryotherapy  15 Minutes    Cryotherapy Location  Ankle    Type of Cryotherapy  Ice pack      Electrical Stimulation   Electrical Stimulation Location  bottom RT  foot    Electrical Stimulation Action  IFC    Electrical Stimulation Goals  Pain      Manual Therapy   Passive ROM  no tightness in ankle or LE neg neural tension               PT Short Term Goals - 04/07/17 1100      PT SHORT TERM GOAL #1   Title  reports 50% less pain    Status  Partially Met      PT SHORT TERM GOAL #2   Title  increase DF to WNL's    Period  Days    Status  Achieved      PT SHORT TERM GOAL #3   Status  Achieved               Plan - 04/07/17 1128    Clinical Impression Statement  no issues  with steps and full ROM. 2/3 LTG met. Pain getting better- c/o symptoms with prolonged standing. No issues with ex but cuing for ex control and speed needed    PT Treatment/Interventions  ADLs/Self Care Home Management;Cryotherapy;Electrical Stimulation;Iontophoresis 68m/ml Dexamethasone;Moist Heat;Ultrasound;Gait training;Stair training;Functional mobility training;Patient/family education;Balance training;Therapeutic exercise;Therapeutic activities;Manual techniques;Taping    PT Next Visit Plan  assure appropraite gym ex and prepare for D/C       Patient will benefit from skilled therapeutic intervention in order to improve the following deficits and impairments:  Abnormal gait, Decreased balance, Postural dysfunction, Improper body mechanics, Impaired flexibility, Pain, Increased muscle spasms, Difficulty walking, Decreased range of motion  Visit Diagnosis: Pain in right foot  Difficulty in walking, not elsewhere classified     Problem List Patient Active Problem List   Diagnosis Date Noted  . FIBROIDS, UTERUS 03/25/2009  . SORE THROAT 03/25/2009  . VAGINITIS 03/25/2009  . DERMATITIS, ALLERGIC 03/25/2009  . BRONCHOPNEUMONIA 11/27/2008  . PLEURISY 11/27/2008  . UPPER RESPIRATORY INFECTION (URI) 11/18/2008  . ROSACEA 11/18/2008  . ANEMIA-NOS 01/09/2008  . HYPOTHYROIDISM 02/06/2007  . VITAMIN B12 DEFICIENCY 02/06/2007   PHYSICAL THERAPY  DISCHARGE SUMMARY   Plan: Patient agrees to discharge.  Patient goals were partially met. Patient is being discharged due to being pleased with the current functional level.  ?????      Arnol Mcgibbon,ANGIE PTA 04/07/2017, 11:32 AM  [address] [address]Phone: [phone]  Fax:  [phone] Name: Mikayla KLINGELMRN: [id number] of Birth: 612-04-1956

## 2017-05-12 DIAGNOSIS — H40023 Open angle with borderline findings, high risk, bilateral: Secondary | ICD-10-CM | POA: Diagnosis not present

## 2017-05-12 DIAGNOSIS — H25813 Combined forms of age-related cataract, bilateral: Secondary | ICD-10-CM | POA: Diagnosis not present

## 2017-06-05 DIAGNOSIS — H40003 Preglaucoma, unspecified, bilateral: Secondary | ICD-10-CM | POA: Diagnosis not present

## 2017-08-07 ENCOUNTER — Other Ambulatory Visit: Payer: Self-pay | Admitting: Internal Medicine

## 2017-08-07 DIAGNOSIS — Z1231 Encounter for screening mammogram for malignant neoplasm of breast: Secondary | ICD-10-CM

## 2017-08-22 DIAGNOSIS — D3A092 Benign carcinoid tumor of the stomach: Secondary | ICD-10-CM | POA: Diagnosis not present

## 2017-08-22 DIAGNOSIS — Z86010 Personal history of colonic polyps: Secondary | ICD-10-CM | POA: Diagnosis not present

## 2017-08-24 ENCOUNTER — Ambulatory Visit
Admission: RE | Admit: 2017-08-24 | Discharge: 2017-08-24 | Disposition: A | Discharge status: Home / Self Care | Payer: BLUE CROSS/BLUE SHIELD | Source: Ambulatory Visit | Attending: Internal Medicine | Admitting: Internal Medicine

## 2017-08-24 DIAGNOSIS — Z1231 Encounter for screening mammogram for malignant neoplasm of breast: Secondary | ICD-10-CM

## 2017-09-04 DIAGNOSIS — Z86010 Personal history of colonic polyps: Secondary | ICD-10-CM | POA: Diagnosis not present

## 2017-09-04 DIAGNOSIS — D371 Neoplasm of uncertain behavior of stomach: Secondary | ICD-10-CM | POA: Diagnosis not present

## 2017-09-06 ENCOUNTER — Other Ambulatory Visit: Payer: Self-pay | Admitting: Gastroenterology

## 2017-09-06 DIAGNOSIS — Z01419 Encounter for gynecological examination (general) (routine) without abnormal findings: Secondary | ICD-10-CM | POA: Diagnosis not present

## 2017-09-06 DIAGNOSIS — Z6823 Body mass index (BMI) 23.0-23.9, adult: Secondary | ICD-10-CM | POA: Diagnosis not present

## 2017-09-25 DIAGNOSIS — J329 Chronic sinusitis, unspecified: Secondary | ICD-10-CM | POA: Diagnosis not present

## 2017-09-25 DIAGNOSIS — E039 Hypothyroidism, unspecified: Secondary | ICD-10-CM | POA: Diagnosis not present

## 2017-10-25 DIAGNOSIS — Z131 Encounter for screening for diabetes mellitus: Secondary | ICD-10-CM | POA: Diagnosis not present

## 2017-10-25 DIAGNOSIS — Z0000 Encounter for general adult medical examination without abnormal findings: Secondary | ICD-10-CM | POA: Diagnosis not present

## 2017-10-25 DIAGNOSIS — E039 Hypothyroidism, unspecified: Secondary | ICD-10-CM | POA: Diagnosis not present

## 2017-11-02 DIAGNOSIS — Z0000 Encounter for general adult medical examination without abnormal findings: Secondary | ICD-10-CM | POA: Diagnosis not present

## 2017-11-02 DIAGNOSIS — E039 Hypothyroidism, unspecified: Secondary | ICD-10-CM | POA: Diagnosis not present

## 2017-11-17 ENCOUNTER — Other Ambulatory Visit: Payer: Self-pay

## 2017-11-17 ENCOUNTER — Ambulatory Visit (HOSPITAL_COMMUNITY)
Admission: RE | Admit: 2017-11-17 | Discharge: 2017-11-17 | Disposition: A | Discharge status: Home / Self Care | Payer: BLUE CROSS/BLUE SHIELD | Source: Ambulatory Visit | Attending: Gastroenterology | Admitting: Gastroenterology

## 2017-11-17 ENCOUNTER — Encounter (HOSPITAL_COMMUNITY)
Admission: RE | Disposition: A | Discharge status: Home / Self Care | Payer: Self-pay | Source: Ambulatory Visit | Attending: Gastroenterology

## 2017-11-17 ENCOUNTER — Encounter (HOSPITAL_COMMUNITY): Payer: Self-pay | Admitting: *Deleted

## 2017-11-17 ENCOUNTER — Ambulatory Visit (HOSPITAL_COMMUNITY): Payer: BLUE CROSS/BLUE SHIELD | Admitting: Anesthesiology

## 2017-11-17 DIAGNOSIS — K2950 Unspecified chronic gastritis without bleeding: Secondary | ICD-10-CM | POA: Diagnosis not present

## 2017-11-17 DIAGNOSIS — Z882 Allergy status to sulfonamides status: Secondary | ICD-10-CM | POA: Insufficient documentation

## 2017-11-17 DIAGNOSIS — Z881 Allergy status to other antibiotic agents status: Secondary | ICD-10-CM | POA: Diagnosis not present

## 2017-11-17 DIAGNOSIS — D3A092 Benign carcinoid tumor of the stomach: Secondary | ICD-10-CM | POA: Diagnosis not present

## 2017-11-17 DIAGNOSIS — Z85831 Personal history of malignant neoplasm of soft tissue: Secondary | ICD-10-CM | POA: Insufficient documentation

## 2017-11-17 DIAGNOSIS — Z885 Allergy status to narcotic agent status: Secondary | ICD-10-CM | POA: Diagnosis not present

## 2017-11-17 DIAGNOSIS — K2970 Gastritis, unspecified, without bleeding: Secondary | ICD-10-CM | POA: Diagnosis not present

## 2017-11-17 DIAGNOSIS — D259 Leiomyoma of uterus, unspecified: Secondary | ICD-10-CM | POA: Diagnosis not present

## 2017-11-17 DIAGNOSIS — E039 Hypothyroidism, unspecified: Secondary | ICD-10-CM | POA: Diagnosis not present

## 2017-11-17 DIAGNOSIS — K317 Polyp of stomach and duodenum: Secondary | ICD-10-CM | POA: Diagnosis not present

## 2017-11-17 HISTORY — PX: UPPER ESOPHAGEAL ENDOSCOPIC ULTRASOUND (EUS): SHX6562

## 2017-11-17 HISTORY — PX: POLYPECTOMY: SHX5525

## 2017-11-17 HISTORY — PX: ESOPHAGOGASTRODUODENOSCOPY (EGD) WITH PROPOFOL: SHX5813

## 2017-11-17 HISTORY — PX: BIOPSY: SHX5522

## 2017-11-17 SURGERY — ESOPHAGOGASTRODUODENOSCOPY (EGD) WITH PROPOFOL
Anesthesia: Monitor Anesthesia Care

## 2017-11-17 MED ORDER — PROPOFOL 10 MG/ML IV BOLUS
INTRAVENOUS | Status: AC
  Filled 2017-11-17: qty 60

## 2017-11-17 MED ORDER — PROPOFOL 500 MG/50ML IV EMUL
INTRAVENOUS | Status: DC | PRN
  Administered 2017-11-17: 100 ug/kg/min via INTRAVENOUS

## 2017-11-17 MED ORDER — SODIUM CHLORIDE 0.9 % IV SOLN: INTRAVENOUS | Status: DC

## 2017-11-17 MED ORDER — PROPOFOL 10 MG/ML IV BOLUS
INTRAVENOUS | Status: DC | PRN
  Administered 2017-11-17 (×2): 10 mg via INTRAVENOUS
  Administered 2017-11-17: 20 mg via INTRAVENOUS

## 2017-11-17 MED ORDER — LACTATED RINGERS IV SOLN
INTRAVENOUS | Status: DC
  Administered 2017-11-17: 1000 mL via INTRAVENOUS

## 2017-11-17 SURGICAL SUPPLY — 15 items

## 2017-11-17 NOTE — Anesthesia Preprocedure Evaluation (Signed)
Anesthesia Evaluation  Patient identified by MRN, date of birth, ID band Patient awake    Reviewed: Allergy & Precautions, NPO status , Patient's Chart, lab work & pertinent test results  Airway Mallampati: II  TM Distance: >3 FB Neck ROM: Full    Dental no notable dental hx. (+) Teeth Intact   Pulmonary neg pulmonary ROS,    Pulmonary exam normal breath sounds clear to auscultation       Cardiovascular negative cardio ROS Normal cardiovascular exam Rhythm:Regular Rate:Normal     Neuro/Psych negative neurological ROS  negative psych ROS   GI/Hepatic negative GI ROS, Neg liver ROS,   Endo/Other  Hypothyroidism   Renal/GU negative Renal ROS  negative genitourinary   Musculoskeletal negative musculoskeletal ROS (+)   Abdominal Normal abdominal exam  (+)   Peds negative pediatric ROS (+)  Hematology negative hematology ROS (+)   Anesthesia Other Findings   Reproductive/Obstetrics negative OB ROS                             Anesthesia Physical  Anesthesia Plan  ASA: II  Anesthesia Plan: MAC   Post-op Pain Management:    Induction: Intravenous  PONV Risk Score and Plan:   Airway Management Planned: Natural Airway and Mask  Additional Equipment:   Intra-op Plan:   Post-operative Plan:   Informed Consent: I have reviewed the patients History and Physical, chart, labs and discussed the procedure including the risks, benefits and alternatives for the proposed anesthesia with the patient or authorized representative who has indicated his/her understanding and acceptance.   Dental advisory given  Plan Discussed with: CRNA  Anesthesia Plan Comments:         Anesthesia Quick Evaluation

## 2017-11-17 NOTE — Anesthesia Postprocedure Evaluation (Signed)
Anesthesia Post Note  Patient: Mikayla Spears  Procedure(s) Performed: ESOPHAGOGASTRODUODENOSCOPY (EGD) WITH PROPOFOL (N/A ) UPPER ESOPHAGEAL ENDOSCOPIC ULTRASOUND (EUS) (N/A ) BIOPSY POLYPECTOMY     Patient location during evaluation: Endoscopy Anesthesia Type: MAC Level of consciousness: awake Pain management: pain level controlled Vital Signs Assessment: post-procedure vital signs reviewed and stable Respiratory status: spontaneous breathing Cardiovascular status: stable Postop Assessment: no apparent nausea or vomiting Anesthetic complications: no    Last Vitals:  Vitals:   11/17/17 0840 11/17/17 0900  BP: 114/66 (!) 149/79  Pulse: (!) 58 (!) 51  Resp: 16 12  Temp: 36.4 C   SpO2: 100% 100%    Last Pain:  Vitals:   11/17/17 0900  TempSrc:   PainSc: 0-No pain   Pain Goal:                 Azhane Eckart JR,JOHN Carthel [place]

## 2017-11-17 NOTE — Discharge Instructions (Signed)

## 2017-11-17 NOTE — H&P (Signed)
  Mikayla Spears HPI: This 62 year old [place] female presents to the office for a 1 year repeat EUS/ EGD for a low grade carcinoid of the stomach. She has 2 BM's per day with no obvious blood or mucus in the stool. She has good appetite and her weight has been stable. She denies having any complaints of abdominal pain, nausea, vomiting, acid reflux, dysphagia or odynophagia. She denies having a family history of colon cancer, celiac sprue or IBD. Her last colonoscopy was done on 12/09/2015 when fragments of tubular adenomas were removed from the hepatic flexure.   History reviewed. No pertinent past medical history.  Past Surgical History:  Procedure Laterality Date  . EUS N/A 07/29/2016   Procedure: UPPER ENDOSCOPIC ULTRASOUND (EUS) LINEAR;  Surgeon: Mikayla Ada, MD;  Location: [facility];  Service: Endoscopy;  Laterality: N/A;    History reviewed. No pertinent family history.  Social History:  reports that she has never smoked. She has never used smokeless tobacco. She reports that she does not drink alcohol or use drugs.  Allergies:  Allergies  Allergen Reactions  . Codeine Hives, Swelling and Rash  . Doxycycline Rash and Other (See Comments)    REACTION: elev LFT  (?)  . Sulfonamide Derivatives Hives, Swelling and Rash    Medications:  Scheduled:  Continuous: . sodium chloride    . lactated ringers 1,000 mL (11/17/17 0712)    No results found for this or any previous visit (from the past 24 hour(s)).   No results found.  ROS:  As stated above in the HPI otherwise negative.  Blood pressure (!) 192/76, pulse (!) 50, temperature 97.8 F (36.6 C), temperature source Oral, resp. rate 13, height 4\' 11"  (1.499 m), weight 50.8 kg (112 lb), SpO2 99 %.    PE: Gen: NAD, Alert and Oriented HEENT:  [place]/AT, EOMI Neck: Supple, no LAD Lungs: CTA Bilaterally CV: RRR without M/G/R ABM: Soft, NTND, +BS Ext: No C/C/E  Assessment/Plan: 1) History of gastric carcinoid -  EUS/EGD  Mikayla Spears D 11/17/2017, 8:01 AM

## 2017-11-17 NOTE — Transfer of Care (Signed)
Immediate Anesthesia Transfer of Care Note  Patient: Mikayla Spears  Procedure(s) Performed: ESOPHAGOGASTRODUODENOSCOPY (EGD) WITH PROPOFOL (N/A ) UPPER ESOPHAGEAL ENDOSCOPIC ULTRASOUND (EUS) (N/A ) BIOPSY  Patient Location: PACU  Anesthesia Type:MAC  Level of Consciousness: sedated  Airway & Oxygen Therapy: Patient Spontanous Breathing and Patient connected to nasal cannula oxygen  Post-op Assessment: Report given to RN and Post -op Vital signs reviewed and stable  Post vital signs: Reviewed and stable  Last Vitals:  Vitals Value Taken Time  BP 114/66 11/17/2017  8:41 AM  Temp    Pulse 58 11/17/2017  8:41 AM  Resp 18 11/17/2017  8:41 AM  SpO2 100 % 11/17/2017  8:41 AM  Vitals shown include unvalidated device data.  Last Pain:  Vitals:   11/17/17 0707  TempSrc: Oral  PainSc: 0-No pain         Complications: No apparent anesthesia complications

## 2017-11-17 NOTE — Op Note (Signed)
[facility] Patient Name: Mikayla Spears Procedure Date: 11/17/2017 MRN: [id number] Attending MD: Carol Ada , MD Date of Birth: Oct 16, 1955 CSN: [id number] Age: 62 Admit Type: Outpatient Procedure:                Upper EUS Indications:              History of gastric carcinoid Providers:                Carol Ada, MD, Tory Emerald, RN, William Dalton, Technician Referring MD:              Medicines:                Propofol per Anesthesia Complications:            No immediate complications. Estimated Blood Loss:     Estimated blood loss was minimal. Procedure:                Pre-Anesthesia Assessment:                           - Prior to the procedure, a History and Physical                            was performed, and patient medications and                            allergies were reviewed. The patient's tolerance of                            previous anesthesia was also reviewed. The risks                            and benefits of the procedure and the sedation                            options and risks were discussed with the patient.                            All questions were answered, and informed consent                            was obtained. Prior Anticoagulants: The patient has                            taken no previous anticoagulant or antiplatelet                            agents. ASA Grade Assessment: II - A patient with                            mild systemic disease. After reviewing the risks  and benefits, the patient was deemed in                            satisfactory condition to undergo the procedure.                           - Sedation was administered by an anesthesia                            professional. Deep sedation was attained.                           After obtaining informed consent, the endoscope was                            passed under direct vision.  Throughout the                            procedure, the patient's blood pressure, pulse, and                            oxygen saturations were monitored continuously. The                            [id number] ([id number]) scope was introduced through                            the mouth, and advanced to the second part of                            duodenum. The upper EUS was accomplished without                            difficulty. The patient tolerated the procedure                            well. Scope In: Scope Out: Findings:      ENDOSCOPIC FINDING: :      The examined esophagus was endoscopically normal.      Diffuse mild inflammation characterized by erythema was found in the       gastric fundus. Biopsies were taken with a cold forceps for histology.      The examined duodenum was endoscopically normal.      ENDOSONOGRAPHIC FINDING: :      The esophagus, stomach and duodenum were visualized endosonographically.      A round mass was identified endosonographically in the left lobe of the       liver. The mass was hyperechoic. The mass measured 23 mm by 24 mm in       maximal cross-sectional diameter. The endosonographic borders were       well-defined.      Visualized portions of the spleen, left adrenal gland, biliary system       and pancreas were normal on ultrasound examination.      The EUS was only significant for a hyperechoic left lobe liver lesion as  described above. Gross visualization of the gastric fundus did show two       very small polypoid lesions. An attempt to remove them with a snare       failed and they were removed with biopsy forceps. Further biopsies of       the fundus were performed as there was finding of an intestinal       metaplasia during the last examination. In a separate bottle body and       antral biopsies were obtained to evaluate for further evidence of       intestinal metaplasia. Impression:               - Normal esophagus.                            - Chronic gastritis. Biopsied.                           - Normal examined duodenum.                           - A mass was found in the left lobe of the liver. Moderate Sedation:      N/A- Per Anesthesia Care Recommendation:           - Patient has a contact number available for                            emergencies. The signs and symptoms of potential                            delayed complications were discussed with the                            patient. Return to normal activities tomorrow.                            Written discharge instructions were provided to the                            patient.                           - Resume regular diet.                           - Await path results. If there is extensive                            intestinal metaplasia, a H. pylori breath test will                            be performed. Also repeat EGD on a routine basis,                            i.e., q2 years will be recommended.                           -  CT scan of the abdomen.                           - No further need for repeat EUS examinations. Procedure Code(s):        --- Professional ---                           [phone], Esophagogastroduodenoscopy, flexible,                            transoral; with endoscopic ultrasound examination,                            including the esophagus, stomach, and either the                            duodenum or a surgically altered stomach where the                            jejunum is examined distal to the anastomosis                           43239, 59, Esophagogastroduodenoscopy, flexible,                            transoral; with biopsy, single or multiple Diagnosis Code(s):        --- Professional ---                           K29.50, Unspecified chronic gastritis without                            bleeding                           R16.0, Hepatomegaly, not elsewhere classified CPT copyright 2017  American Medical Association. All rights reserved. The codes documented in this report are preliminary and upon coder review may  be revised to meet current compliance requirements. Carol Ada, MD Carol Ada, MD 11/17/2017 8:53:17 AM This report has been signed electronically. Number of Addenda: 0

## 2017-11-20 ENCOUNTER — Encounter (HOSPITAL_COMMUNITY): Payer: Self-pay | Admitting: Gastroenterology

## 2017-11-23 DIAGNOSIS — K219 Gastro-esophageal reflux disease without esophagitis: Secondary | ICD-10-CM | POA: Diagnosis not present

## 2017-11-23 DIAGNOSIS — K3189 Other diseases of stomach and duodenum: Secondary | ICD-10-CM | POA: Diagnosis not present

## 2017-12-27 ENCOUNTER — Other Ambulatory Visit: Payer: Self-pay | Admitting: Gastroenterology

## 2017-12-27 DIAGNOSIS — R933 Abnormal findings on diagnostic imaging of other parts of digestive tract: Secondary | ICD-10-CM

## 2018-01-10 ENCOUNTER — Ambulatory Visit
Admission: RE | Admit: 2018-01-10 | Discharge: 2018-01-10 | Disposition: A | Discharge status: Home / Self Care | Payer: BLUE CROSS/BLUE SHIELD | Source: Ambulatory Visit | Attending: Gastroenterology | Admitting: Gastroenterology

## 2018-01-10 DIAGNOSIS — K7689 Other specified diseases of liver: Secondary | ICD-10-CM | POA: Diagnosis not present

## 2018-01-10 DIAGNOSIS — R933 Abnormal findings on diagnostic imaging of other parts of digestive tract: Secondary | ICD-10-CM

## 2018-01-10 MED ORDER — IOPAMIDOL (ISOVUE-300) INJECTION 61%
100.0000 mL | Freq: Once | INTRAVENOUS | Status: AC | PRN
  Administered 2018-01-10: 100 mL via INTRAVENOUS

## 2018-05-01 DIAGNOSIS — H40023 Open angle with borderline findings, high risk, bilateral: Secondary | ICD-10-CM | POA: Diagnosis not present

## 2018-05-01 DIAGNOSIS — H25813 Combined forms of age-related cataract, bilateral: Secondary | ICD-10-CM | POA: Diagnosis not present

## 2018-07-28 DIAGNOSIS — R300 Dysuria: Secondary | ICD-10-CM | POA: Diagnosis not present

## 2018-07-28 DIAGNOSIS — N3001 Acute cystitis with hematuria: Secondary | ICD-10-CM | POA: Diagnosis not present

## 2018-08-10 DIAGNOSIS — R05 Cough: Secondary | ICD-10-CM | POA: Diagnosis not present

## 2018-08-10 DIAGNOSIS — R300 Dysuria: Secondary | ICD-10-CM | POA: Diagnosis not present

## 2018-11-15 DIAGNOSIS — Z0000 Encounter for general adult medical examination without abnormal findings: Secondary | ICD-10-CM | POA: Diagnosis not present

## 2018-11-15 DIAGNOSIS — E039 Hypothyroidism, unspecified: Secondary | ICD-10-CM | POA: Diagnosis not present

## 2018-11-15 DIAGNOSIS — R7309 Other abnormal glucose: Secondary | ICD-10-CM | POA: Diagnosis not present

## 2018-11-15 DIAGNOSIS — D509 Iron deficiency anemia, unspecified: Secondary | ICD-10-CM | POA: Diagnosis not present

## 2018-11-15 DIAGNOSIS — R300 Dysuria: Secondary | ICD-10-CM | POA: Diagnosis not present

## 2018-11-22 ENCOUNTER — Other Ambulatory Visit: Payer: Self-pay | Admitting: Internal Medicine

## 2018-11-22 DIAGNOSIS — Z1231 Encounter for screening mammogram for malignant neoplasm of breast: Secondary | ICD-10-CM

## 2018-11-27 ENCOUNTER — Other Ambulatory Visit: Payer: Self-pay

## 2018-11-27 ENCOUNTER — Ambulatory Visit
Admission: RE | Admit: 2018-11-27 | Discharge: 2018-11-27 | Disposition: A | Discharge status: Home / Self Care | Payer: BC Managed Care – PPO | Source: Ambulatory Visit | Attending: Internal Medicine | Admitting: Internal Medicine

## 2018-11-27 DIAGNOSIS — Z1231 Encounter for screening mammogram for malignant neoplasm of breast: Secondary | ICD-10-CM | POA: Diagnosis not present

## 2018-11-29 DIAGNOSIS — Z0000 Encounter for general adult medical examination without abnormal findings: Secondary | ICD-10-CM | POA: Diagnosis not present

## 2018-11-29 DIAGNOSIS — E039 Hypothyroidism, unspecified: Secondary | ICD-10-CM | POA: Diagnosis not present

## 2018-11-29 DIAGNOSIS — R002 Palpitations: Secondary | ICD-10-CM | POA: Diagnosis not present

## 2018-11-29 DIAGNOSIS — Z7189 Other specified counseling: Secondary | ICD-10-CM | POA: Diagnosis not present

## 2019-04-05 DIAGNOSIS — L509 Urticaria, unspecified: Secondary | ICD-10-CM | POA: Diagnosis not present

## 2019-04-05 DIAGNOSIS — Z7189 Other specified counseling: Secondary | ICD-10-CM | POA: Diagnosis not present

## 2019-04-15 DIAGNOSIS — R21 Rash and other nonspecific skin eruption: Secondary | ICD-10-CM | POA: Diagnosis not present

## 2019-06-06 DIAGNOSIS — H40023 Open angle with borderline findings, high risk, bilateral: Secondary | ICD-10-CM | POA: Diagnosis not present

## 2019-06-06 DIAGNOSIS — H25813 Combined forms of age-related cataract, bilateral: Secondary | ICD-10-CM | POA: Diagnosis not present

## 2019-06-06 DIAGNOSIS — H5203 Hypermetropia, bilateral: Secondary | ICD-10-CM | POA: Diagnosis not present

## 2019-06-06 DIAGNOSIS — H524 Presbyopia: Secondary | ICD-10-CM | POA: Diagnosis not present

## 2019-06-06 DIAGNOSIS — H52223 Regular astigmatism, bilateral: Secondary | ICD-10-CM | POA: Diagnosis not present

## 2019-06-17 DIAGNOSIS — R21 Rash and other nonspecific skin eruption: Secondary | ICD-10-CM | POA: Diagnosis not present

## 2019-07-01 DIAGNOSIS — H25813 Combined forms of age-related cataract, bilateral: Secondary | ICD-10-CM | POA: Diagnosis not present

## 2019-07-01 DIAGNOSIS — H40022 Open angle with borderline findings, high risk, left eye: Secondary | ICD-10-CM | POA: Diagnosis not present

## 2019-07-01 DIAGNOSIS — H401111 Primary open-angle glaucoma, right eye, mild stage: Secondary | ICD-10-CM | POA: Diagnosis not present

## 2019-07-28 ENCOUNTER — Ambulatory Visit: Payer: BC Managed Care – PPO | Attending: Internal Medicine

## 2019-07-28 DIAGNOSIS — Z23 Encounter for immunization: Secondary | ICD-10-CM | POA: Insufficient documentation

## 2019-07-28 NOTE — Progress Notes (Signed)
   Covid-19 Vaccination Clinic  Name:  Mikayla Spears    MRN: [id number] DOB: 11/20/1955  07/28/2019  [name] was observed post Covid-19 immunization for 15 minutes without incidence. She was provided with Vaccine Information Sheet and instruction to access the V-Safe system.   [name] was instructed to call 911 with any severe reactions post vaccine: Marland Kitchen Difficulty breathing  . Swelling of your face and throat  . A fast heartbeat  . A bad rash all over your body  . Dizziness and weakness    Immunizations Administered    Name Date Dose VIS Date Route   Pfizer COVID-19 Vaccine 07/28/2019  3:00 PM 0.3 mL 05/10/2019 Intramuscular   Manufacturer: [employer]   Lot: [id number]   [place]: [id number]

## 2019-08-19 DIAGNOSIS — H401111 Primary open-angle glaucoma, right eye, mild stage: Secondary | ICD-10-CM | POA: Diagnosis not present

## 2019-09-02 ENCOUNTER — Ambulatory Visit: Payer: BC Managed Care – PPO

## 2019-09-04 ENCOUNTER — Ambulatory Visit: Payer: BC Managed Care – PPO | Attending: Internal Medicine

## 2019-09-04 DIAGNOSIS — Z23 Encounter for immunization: Secondary | ICD-10-CM

## 2019-09-04 NOTE — Progress Notes (Signed)
   Covid-19 Vaccination Clinic  Name:  Mikayla Spears    MRN: [id number] DOB: 1956-04-10  09/04/2019  [name] was observed post Covid-19 immunization for 15 minutes without incident. She was provided with Vaccine Information Sheet and instruction to access the V-Safe system.   [name] was instructed to call 911 with any severe reactions post vaccine: Marland Kitchen Difficulty breathing  . Swelling of face and throat  . A fast heartbeat  . A bad rash all over body  . Dizziness and weakness   Immunizations Administered    Name Date Dose VIS Date Route   Pfizer COVID-19 Vaccine 09/04/2019  9:16 AM 0.3 mL 05/10/2019 Intramuscular   Manufacturer: [employer], [employer]   Lot: [id number]   [place]: [id number]

## 2019-09-09 DIAGNOSIS — Z6822 Body mass index (BMI) 22.0-22.9, adult: Secondary | ICD-10-CM | POA: Diagnosis not present

## 2019-09-09 DIAGNOSIS — Z01419 Encounter for gynecological examination (general) (routine) without abnormal findings: Secondary | ICD-10-CM | POA: Diagnosis not present

## 2019-09-30 DIAGNOSIS — H40022 Open angle with borderline findings, high risk, left eye: Secondary | ICD-10-CM | POA: Diagnosis not present

## 2019-09-30 DIAGNOSIS — H401111 Primary open-angle glaucoma, right eye, mild stage: Secondary | ICD-10-CM | POA: Diagnosis not present

## 2019-09-30 DIAGNOSIS — H25813 Combined forms of age-related cataract, bilateral: Secondary | ICD-10-CM | POA: Diagnosis not present

## 2019-11-07 ENCOUNTER — Other Ambulatory Visit: Payer: Self-pay | Admitting: Internal Medicine

## 2019-12-03 ENCOUNTER — Other Ambulatory Visit: Payer: Self-pay

## 2019-12-03 ENCOUNTER — Ambulatory Visit
Admission: RE | Admit: 2019-12-03 | Discharge: 2019-12-03 | Disposition: A | Discharge status: Home / Self Care | Payer: BC Managed Care – PPO | Source: Ambulatory Visit | Attending: Internal Medicine | Admitting: Internal Medicine

## 2019-12-03 ENCOUNTER — Other Ambulatory Visit: Payer: Self-pay | Admitting: Internal Medicine

## 2019-12-03 DIAGNOSIS — Z1231 Encounter for screening mammogram for malignant neoplasm of breast: Secondary | ICD-10-CM

## 2019-12-12 DIAGNOSIS — Z0000 Encounter for general adult medical examination without abnormal findings: Secondary | ICD-10-CM | POA: Diagnosis not present

## 2019-12-12 DIAGNOSIS — E039 Hypothyroidism, unspecified: Secondary | ICD-10-CM | POA: Diagnosis not present

## 2019-12-12 DIAGNOSIS — D3A092 Benign carcinoid tumor of the stomach: Secondary | ICD-10-CM | POA: Diagnosis not present

## 2019-12-12 DIAGNOSIS — Z86010 Personal history of colonic polyps: Secondary | ICD-10-CM | POA: Diagnosis not present

## 2019-12-16 ENCOUNTER — Other Ambulatory Visit: Payer: Self-pay | Admitting: Gastroenterology

## 2019-12-19 DIAGNOSIS — E039 Hypothyroidism, unspecified: Secondary | ICD-10-CM | POA: Diagnosis not present

## 2019-12-19 DIAGNOSIS — Z0000 Encounter for general adult medical examination without abnormal findings: Secondary | ICD-10-CM | POA: Diagnosis not present

## 2019-12-19 DIAGNOSIS — L719 Rosacea, unspecified: Secondary | ICD-10-CM | POA: Diagnosis not present

## 2020-04-14 DIAGNOSIS — H25813 Combined forms of age-related cataract, bilateral: Secondary | ICD-10-CM | POA: Diagnosis not present

## 2020-04-14 DIAGNOSIS — H40022 Open angle with borderline findings, high risk, left eye: Secondary | ICD-10-CM | POA: Diagnosis not present

## 2020-04-14 DIAGNOSIS — H401111 Primary open-angle glaucoma, right eye, mild stage: Secondary | ICD-10-CM | POA: Diagnosis not present

## 2020-04-30 ENCOUNTER — Other Ambulatory Visit (HOSPITAL_COMMUNITY)
Admission: RE | Admit: 2020-04-30 | Discharge: 2020-04-30 | Disposition: A | Discharge status: Home / Self Care | Payer: BC Managed Care – PPO | Source: Ambulatory Visit | Attending: Gastroenterology | Admitting: Gastroenterology

## 2020-04-30 DIAGNOSIS — Z882 Allergy status to sulfonamides status: Secondary | ICD-10-CM | POA: Diagnosis not present

## 2020-04-30 DIAGNOSIS — K317 Polyp of stomach and duodenum: Secondary | ICD-10-CM | POA: Diagnosis not present

## 2020-04-30 DIAGNOSIS — Z885 Allergy status to narcotic agent status: Secondary | ICD-10-CM | POA: Diagnosis not present

## 2020-04-30 DIAGNOSIS — D3A8 Other benign neuroendocrine tumors: Secondary | ICD-10-CM | POA: Diagnosis not present

## 2020-04-30 DIAGNOSIS — Z20822 Contact with and (suspected) exposure to covid-19: Secondary | ICD-10-CM | POA: Insufficient documentation

## 2020-04-30 DIAGNOSIS — K2940 Chronic atrophic gastritis without bleeding: Secondary | ICD-10-CM | POA: Diagnosis not present

## 2020-04-30 DIAGNOSIS — Z881 Allergy status to other antibiotic agents status: Secondary | ICD-10-CM | POA: Diagnosis not present

## 2020-04-30 DIAGNOSIS — Z01812 Encounter for preprocedural laboratory examination: Secondary | ICD-10-CM | POA: Insufficient documentation

## 2020-04-30 LAB — SARS CORONAVIRUS 2 (TAT 6-24 HRS): SARS Coronavirus 2: NEGATIVE

## 2020-04-30 NOTE — Anesthesia Preprocedure Evaluation (Addendum)
Anesthesia Evaluation  Patient identified by MRN, date of birth, ID band Patient awake    Reviewed: Patient's Chart, lab work & pertinent test results  Airway Mallampati: II  TM Distance: >3 FB Neck ROM: Full    Dental  (+) Teeth Intact   Pulmonary neg pulmonary ROS,    Pulmonary exam normal        Cardiovascular negative cardio ROS   Rhythm:Regular Rate:Normal     Neuro/Psych negative neurological ROS  negative psych ROS   GI/Hepatic Neg liver ROS, Gastric carcinoid   Endo/Other  Hypothyroidism   Renal/GU negative Renal ROS  negative genitourinary   Musculoskeletal negative musculoskeletal ROS (+)   Abdominal (+)  Abdomen: soft. Bowel sounds: normal.  Peds  Hematology  (+) anemia ,   Anesthesia Other Findings   Reproductive/Obstetrics                            Anesthesia Physical Anesthesia Plan  ASA: II  Anesthesia Plan: MAC   Post-op Pain Management:    Induction:   PONV Risk Score and Plan: 2 and Propofol infusion  Airway Management Planned: Simple Face Mask, Natural Airway and Nasal Cannula  Additional Equipment: None  Intra-op Plan:   Post-operative Plan:   Informed Consent: I have reviewed the patients History and Physical, chart, labs and discussed the procedure including the risks, benefits and alternatives for the proposed anesthesia with the patient or authorized representative who has indicated his/her understanding and acceptance.     Dental advisory given  Plan Discussed with: CRNA  Anesthesia Plan Comments:        Anesthesia Quick Evaluation

## 2020-04-30 NOTE — Progress Notes (Signed)
Talked with patient due to the fact that she had not had her preop covid test. Told patient she needed to have this done prior to procedure tomorrow. That [facility] 3:30 to get her test. The patient stated that she would be there by 3:30 to get it done.

## 2020-05-01 ENCOUNTER — Encounter (HOSPITAL_COMMUNITY): Payer: Self-pay | Admitting: Gastroenterology

## 2020-05-01 ENCOUNTER — Ambulatory Visit (HOSPITAL_COMMUNITY): Payer: BC Managed Care – PPO | Admitting: Certified Registered Nurse Anesthetist

## 2020-05-01 ENCOUNTER — Ambulatory Visit (HOSPITAL_COMMUNITY)
Admission: RE | Admit: 2020-05-01 | Discharge: 2020-05-01 | Disposition: A | Discharge status: Home / Self Care | Payer: BC Managed Care – PPO | Attending: Gastroenterology | Admitting: Gastroenterology

## 2020-05-01 ENCOUNTER — Encounter (HOSPITAL_COMMUNITY)
Admission: RE | Disposition: A | Discharge status: Home / Self Care | Payer: Self-pay | Source: Home / Self Care | Attending: Gastroenterology

## 2020-05-01 ENCOUNTER — Other Ambulatory Visit: Payer: Self-pay

## 2020-05-01 DIAGNOSIS — Z885 Allergy status to narcotic agent status: Secondary | ICD-10-CM | POA: Diagnosis not present

## 2020-05-01 DIAGNOSIS — Z20822 Contact with and (suspected) exposure to covid-19: Secondary | ICD-10-CM | POA: Insufficient documentation

## 2020-05-01 DIAGNOSIS — D3A8 Other benign neuroendocrine tumors: Secondary | ICD-10-CM | POA: Diagnosis not present

## 2020-05-01 DIAGNOSIS — E039 Hypothyroidism, unspecified: Secondary | ICD-10-CM | POA: Diagnosis not present

## 2020-05-01 DIAGNOSIS — K317 Polyp of stomach and duodenum: Secondary | ICD-10-CM | POA: Diagnosis not present

## 2020-05-01 DIAGNOSIS — K3189 Other diseases of stomach and duodenum: Secondary | ICD-10-CM | POA: Diagnosis not present

## 2020-05-01 DIAGNOSIS — K2940 Chronic atrophic gastritis without bleeding: Secondary | ICD-10-CM | POA: Insufficient documentation

## 2020-05-01 DIAGNOSIS — C7A8 Other malignant neuroendocrine tumors: Secondary | ICD-10-CM | POA: Diagnosis not present

## 2020-05-01 DIAGNOSIS — K2970 Gastritis, unspecified, without bleeding: Secondary | ICD-10-CM | POA: Diagnosis not present

## 2020-05-01 DIAGNOSIS — Z881 Allergy status to other antibiotic agents status: Secondary | ICD-10-CM | POA: Diagnosis not present

## 2020-05-01 DIAGNOSIS — Z882 Allergy status to sulfonamides status: Secondary | ICD-10-CM | POA: Insufficient documentation

## 2020-05-01 DIAGNOSIS — K2950 Unspecified chronic gastritis without bleeding: Secondary | ICD-10-CM | POA: Diagnosis not present

## 2020-05-01 DIAGNOSIS — D638 Anemia in other chronic diseases classified elsewhere: Secondary | ICD-10-CM | POA: Diagnosis not present

## 2020-05-01 HISTORY — PX: ESOPHAGOGASTRODUODENOSCOPY (EGD) WITH PROPOFOL: SHX5813

## 2020-05-01 HISTORY — PX: BIOPSY: SHX5522

## 2020-05-01 HISTORY — PX: UPPER ESOPHAGEAL ENDOSCOPIC ULTRASOUND (EUS): SHX6562

## 2020-05-01 SURGERY — UPPER ESOPHAGEAL ENDOSCOPIC ULTRASOUND (EUS)
Anesthesia: Monitor Anesthesia Care

## 2020-05-01 MED ORDER — PROPOFOL 500 MG/50ML IV EMUL
INTRAVENOUS | Status: AC
  Filled 2020-05-01: qty 50

## 2020-05-01 MED ORDER — PROPOFOL 10 MG/ML IV BOLUS
INTRAVENOUS | Status: AC
  Filled 2020-05-01: qty 20

## 2020-05-01 MED ORDER — SODIUM CHLORIDE 0.9 % IV SOLN: INTRAVENOUS | Status: DC

## 2020-05-01 MED ORDER — LIDOCAINE 2% (20 MG/ML) 5 ML SYRINGE
INTRAMUSCULAR | Status: DC | PRN
  Administered 2020-05-01: 60 mg via INTRAVENOUS

## 2020-05-01 MED ORDER — PROPOFOL 10 MG/ML IV BOLUS
INTRAVENOUS | Status: DC | PRN
  Administered 2020-05-01 (×3): 20 mg via INTRAVENOUS

## 2020-05-01 MED ORDER — LACTATED RINGERS IV SOLN: INTRAVENOUS | Status: DC

## 2020-05-01 MED ORDER — PROPOFOL 500 MG/50ML IV EMUL
INTRAVENOUS | Status: DC | PRN
  Administered 2020-05-01: 100 ug/kg/min via INTRAVENOUS

## 2020-05-01 NOTE — Op Note (Signed)
[facility] Patient Name: Mikayla Spears Procedure Date: 05/01/2020 MRN: [id number] Attending MD: Carol Ada , MD Date of Birth: 14-Feb-1956 CSN: [id number] Age: 64 Admit Type: Outpatient Procedure:                Upper EUS Indications:              Gastric mucosal mass/polyp found on endoscopy Providers:                Carol Ada, MD, Cleda Daub, RN, Tyna Jaksch                            Technician Referring MD:              Medicines:                Propofol per Anesthesia Complications:            No immediate complications. Estimated Blood Loss:     Estimated blood loss was minimal. Procedure:                Pre-Anesthesia Assessment:                           - Prior to the procedure, a History and Physical                            was performed, and patient medications and                            allergies were reviewed. The patient's tolerance of                            previous anesthesia was also reviewed. The risks                            and benefits of the procedure and the sedation                            options and risks were discussed with the patient.                            All questions were answered, and informed consent                            was obtained. Prior Anticoagulants: The patient has                            taken no previous anticoagulant or antiplatelet                            agents. ASA Grade Assessment: II - A patient with                            mild systemic disease. After reviewing the risks  and benefits, the patient was deemed in                            satisfactory condition to undergo the procedure.                           - Sedation was administered by an anesthesia                            professional. Deep sedation was attained.                           After obtaining informed consent, the endoscope was                            passed under direct  vision. Throughout the                            procedure, the patient's blood pressure, pulse, and                            oxygen saturations were monitored continuously. The                            [id number] ([id number] Olympus Linear EUS was                            introduced through the mouth, and advanced to the                            second part of duodenum. The upper EUS was                            accomplished without difficulty. The patient                            tolerated the procedure well. Scope In: Scope Out: Findings:      ENDOSCOPIC FINDING: :      The examined esophagus was endoscopically normal.      Diffuse moderate inflammation characterized by erythema was found in the       gastric fundus and in the gastric body. Biopsies were taken with a cold       forceps for histology.      ENDOSONOGRAPHIC FINDING: :      There was no sign of significant endosonographic abnormality in the       entire main bile duct. The maximum diameter of the duct was 5 mm.      There was no sign of significant endosonographic abnormality in the       entire pancreas. The pancreatic duct measured up to 2 mm in diameter.      There was no sign of significant endosonographic abnormality in the left       lobe of the liver. Impression:               - Normal esophagus.                           -  Gastritis. Biopsied.                           - There was no sign of significant pathology in the                            entire main bile duct.                           - There was no sign of significant pathology in the                            entire pancreas.                           - There was no evidence of significant pathology in                            the left lobe of the liver. Moderate Sedation:      Not Applicable - Patient had care per Anesthesia. Recommendation:           - Patient has a contact number available for                             emergencies. The signs and symptoms of potential                            delayed complications were discussed with the                            patient. Return to normal activities tomorrow.                            Written discharge instructions were provided to the                            patient.                           - Resume previous diet.                           - Await pathology results.                           - Repeat the upper EGD in 2 years for surveillance.                           - Follow up with Dr. Collene Mares as needed. Procedure Code(s):        --- Professional ---                           [phone], Esophagogastroduodenoscopy, flexible,                            transoral; with endoscopic  ultrasound examination                            limited to the esophagus, stomach or duodenum, and                            adjacent structures                           43239, Esophagogastroduodenoscopy, flexible,                            transoral; with biopsy, single or multiple Diagnosis Code(s):        --- Professional ---                           K29.70, Gastritis, unspecified, without bleeding                           K31.89, Other diseases of stomach and duodenum CPT copyright 2019 [employer]. All rights reserved. The codes documented in this report are preliminary and upon coder review may  be revised to meet current compliance requirements. Carol Ada, MD Carol Ada, MD 05/01/2020 8:11:57 AM This report has been signed electronically. Number of Addenda: 0

## 2020-05-01 NOTE — Transfer of Care (Signed)
Immediate Anesthesia Transfer of Care Note  Patient: Mikayla Spears  Procedure(s) Performed: UPPER ESOPHAGEAL ENDOSCOPIC ULTRASOUND (EUS) (N/A ) BIOPSY  Patient Location: Endoscopy Unit  Anesthesia Type:MAC  Level of Consciousness: drowsy and patient cooperative  Airway & Oxygen Therapy: Patient Spontanous Breathing and Patient connected to face mask oxygen  Post-op Assessment: Report given to RN and Post -op Vital signs reviewed and stable  Post vital signs: Reviewed and stable  Last Vitals:  Vitals Value Taken Time  BP 159/75 05/01/20 0800  Temp    Pulse 49 05/01/20 0805  Resp 15 05/01/20 0805  SpO2 100 % 05/01/20 0805  Vitals shown include unvalidated device data.  Last Pain:  Vitals:   05/01/20 0800  TempSrc:   PainSc: Asleep         Complications: No complications documented.

## 2020-05-01 NOTE — H&P (Signed)
  Mikayla Spears HPI: The EUS in 2018 was positive for several small gastric carcinoids and intestinal metaplasia.  The follow up EUS in 2019 showed the intestinal metaplasia, but no evidence of any residual carcinoid.  The rest of the EUS examinations were unrevealing for any other pathology.  She is here today to undergo routine surveillance of the carcinoid history and intestinal metaplasia.  No past medical history on file.  Past Surgical History:  Procedure Laterality Date  . BIOPSY  11/17/2017   Procedure: BIOPSY;  Surgeon: Carol Ada, MD;  Location: [facility];  Service: Endoscopy;;  . ESOPHAGOGASTRODUODENOSCOPY (EGD) WITH PROPOFOL N/A 11/17/2017   Procedure: ESOPHAGOGASTRODUODENOSCOPY (EGD) WITH PROPOFOL;  Surgeon: Carol Ada, MD;  Location: [facility];  Service: Endoscopy;  Laterality: N/A;  . EUS N/A 07/29/2016   Procedure: UPPER ENDOSCOPIC ULTRASOUND (EUS) LINEAR;  Surgeon: Carol Ada, MD;  Location: [facility];  Service: Endoscopy;  Laterality: N/A;  . POLYPECTOMY  11/17/2017   Procedure: POLYPECTOMY;  Surgeon: Carol Ada, MD;  Location: [facility];  Service: Endoscopy;;  . UPPER ESOPHAGEAL ENDOSCOPIC ULTRASOUND (EUS) N/A 11/17/2017   Procedure: UPPER ESOPHAGEAL ENDOSCOPIC ULTRASOUND (EUS);  Surgeon: Carol Ada, MD;  Location: [facility];  Service: Endoscopy;  Laterality: N/A;    No family history on file.  Social History:  reports that she has never smoked. She has never used smokeless tobacco. She reports that she does not drink alcohol and does not use drugs.  Allergies:  Allergies  Allergen Reactions  . Codeine Hives, Swelling and Rash  . Doxycycline Rash and Other (See Comments)    REACTION: elev LFT  (?)  . Sulfonamide Derivatives Hives, Swelling and Rash    Medications: Scheduled: Continuous:  Results for orders placed or performed during the hospital encounter of 04/30/20 (from the past 24 hour(s))  SARS CORONAVIRUS 2 (TAT 6-24 HRS)  Nasopharyngeal Nasopharyngeal Swab     Status: None   Collection Time: 04/30/20  3:25 PM   Specimen: Nasopharyngeal Swab  Result Value Ref Range   SARS Coronavirus 2 NEGATIVE NEGATIVE     No results found.  ROS:  As stated above in the HPI otherwise negative.  There were no vitals taken for this visit.    PE: Gen: NAD, Alert and Oriented HEENT:  [place]/AT, EOMI Neck: Supple, no LAD Lungs: CTA Bilaterally CV: RRR without M/G/R ABD: Soft, NTND, +BS Ext: No C/C/E  Assessment/Plan: 1) History of gastric carcinoid. 2) Intestinal metaplasia. 3) Atrophic gastritis.   In the past it was recommended to undergo routine EGD examinations, but she feels more comfortable with the EUS/EGD. Mikayla Spears D 05/01/2020, 6:23 AM

## 2020-05-01 NOTE — Discharge Instructions (Signed)

## 2020-05-01 NOTE — Anesthesia Postprocedure Evaluation (Signed)
Anesthesia Post Note  Patient: Mikayla Spears  Procedure(s) Performed: UPPER ESOPHAGEAL ENDOSCOPIC ULTRASOUND (EUS) (N/A ) BIOPSY     Patient location during evaluation: Endoscopy Anesthesia Type: MAC Level of consciousness: awake and alert Pain management: pain level controlled Vital Signs Assessment: post-procedure vital signs reviewed and stable Respiratory status: spontaneous breathing, nonlabored ventilation, respiratory function stable and patient connected to nasal cannula oxygen Cardiovascular status: stable and blood pressure returned to baseline Postop Assessment: no apparent nausea or vomiting Anesthetic complications: no   No complications documented.  Last Vitals:  Vitals:   05/01/20 0810 05/01/20 0820  BP: (!) 162/77 (!) 166/72  Pulse: (!) 55   Resp: 17   Temp: 36.4 C   SpO2: 100% 100%    Last Pain:  Vitals:   05/01/20 0820  TempSrc:   PainSc: 0-No pain                 March Rummage [place]

## 2020-05-04 ENCOUNTER — Other Ambulatory Visit: Payer: Self-pay

## 2020-05-05 ENCOUNTER — Encounter (HOSPITAL_COMMUNITY): Payer: Self-pay | Admitting: Gastroenterology

## 2020-05-05 LAB — SURGICAL PATHOLOGY

## 2020-05-05 IMAGING — MG DIGITAL SCREENING BILATERAL MAMMOGRAM WITH TOMO AND CAD
8 series · 9 of 24 positions shown · non-contrast
Comparison: Previous exam(s).

CLINICAL DATA: Screening.

EXAM:
DIGITAL SCREENING BILATERAL MAMMOGRAM WITH TOMO AND CAD

[R CC synth-2D]
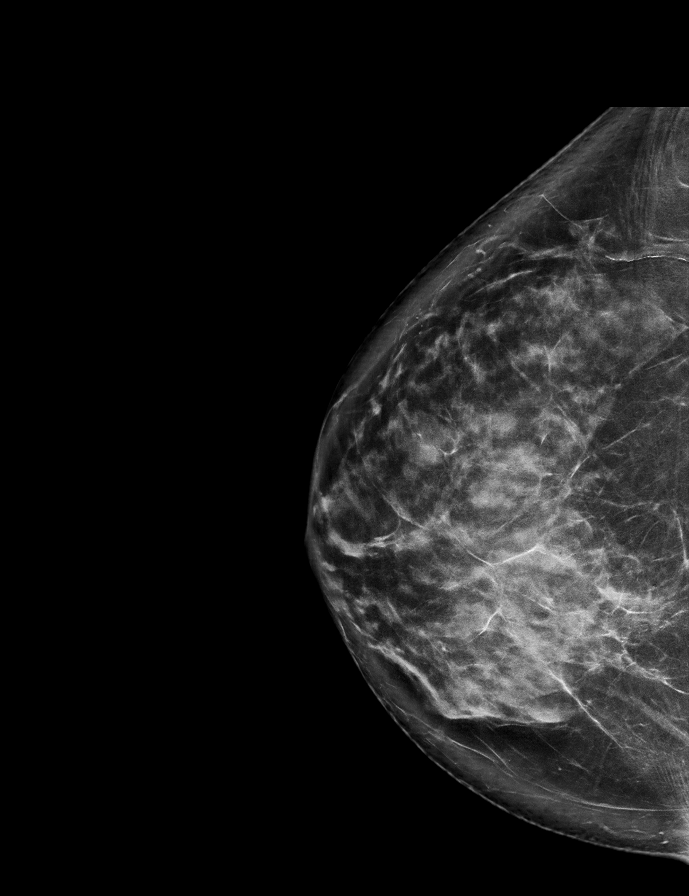

[R MLO synth-2D]
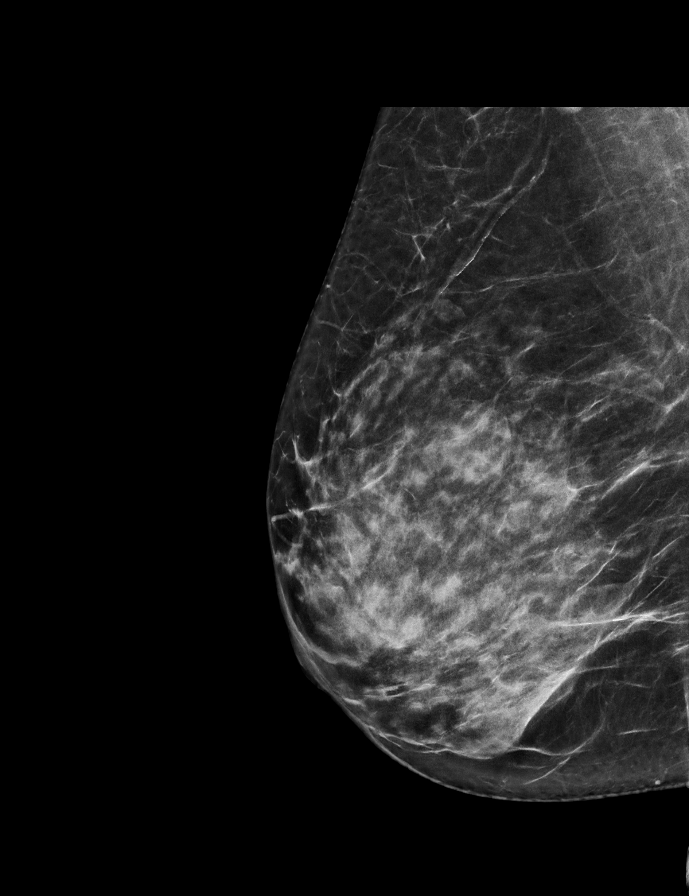

[L CC synth-2D]
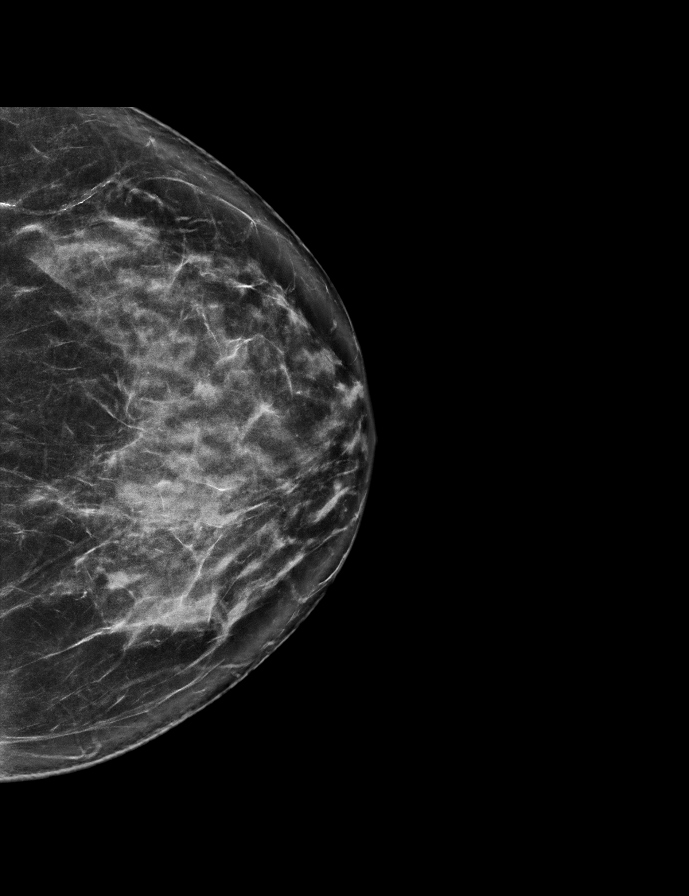

[L MLO synth-2D]
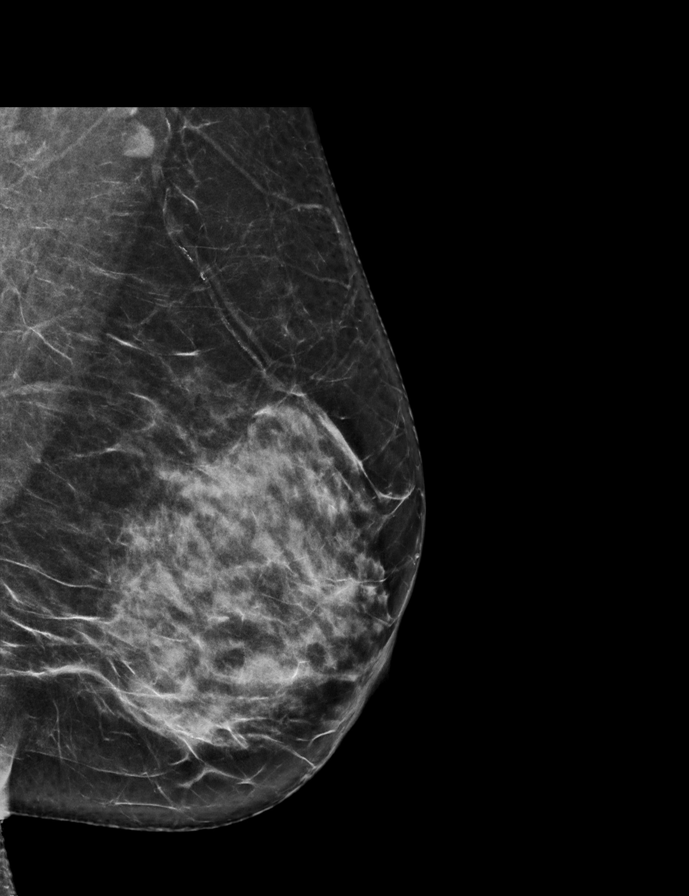

[L MLO tomo · 2 of 74 frames shown]
[frame 24/74]
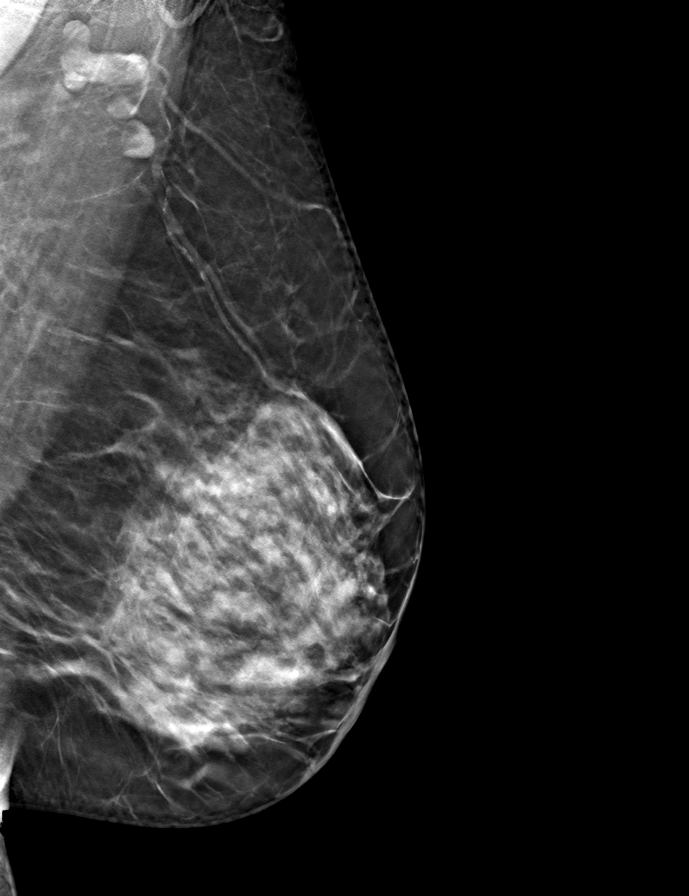
[frame 37/74]
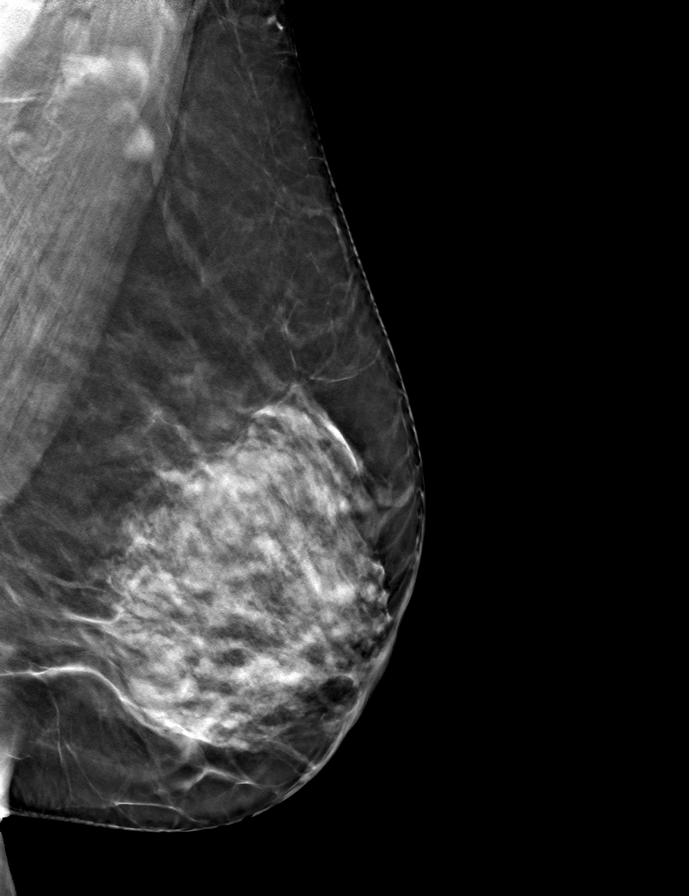

[L CC tomo · tomo slice 37/74.0]
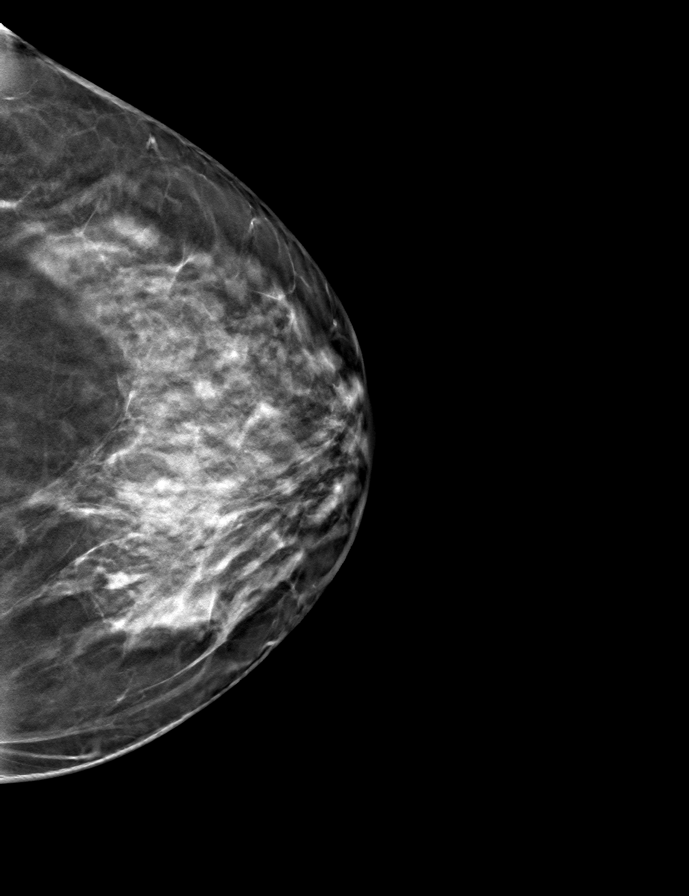

[R CC tomo · tomo slice 43/84.0]
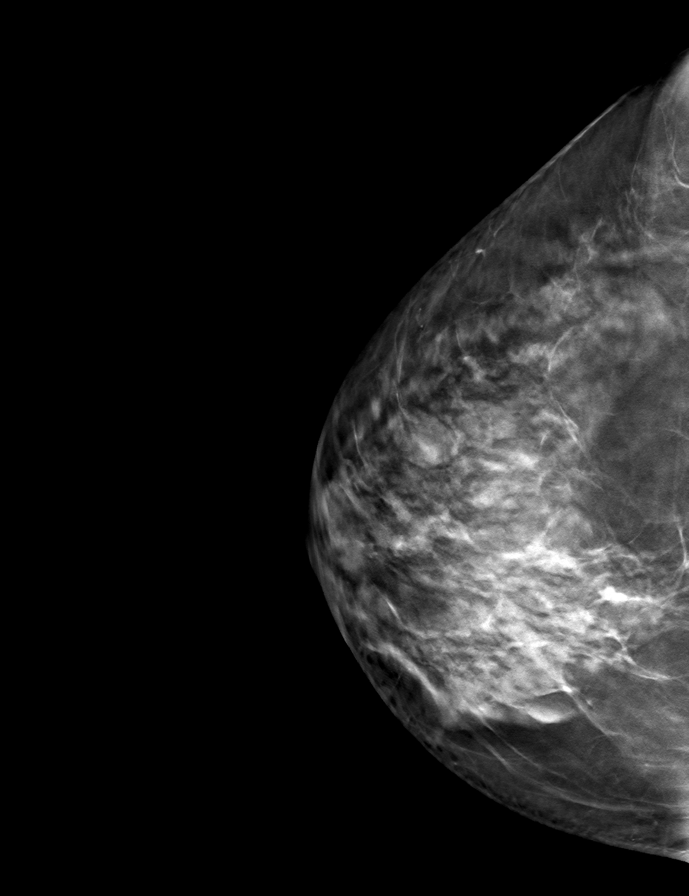

[R MLO tomo · tomo slice 37/72.0]
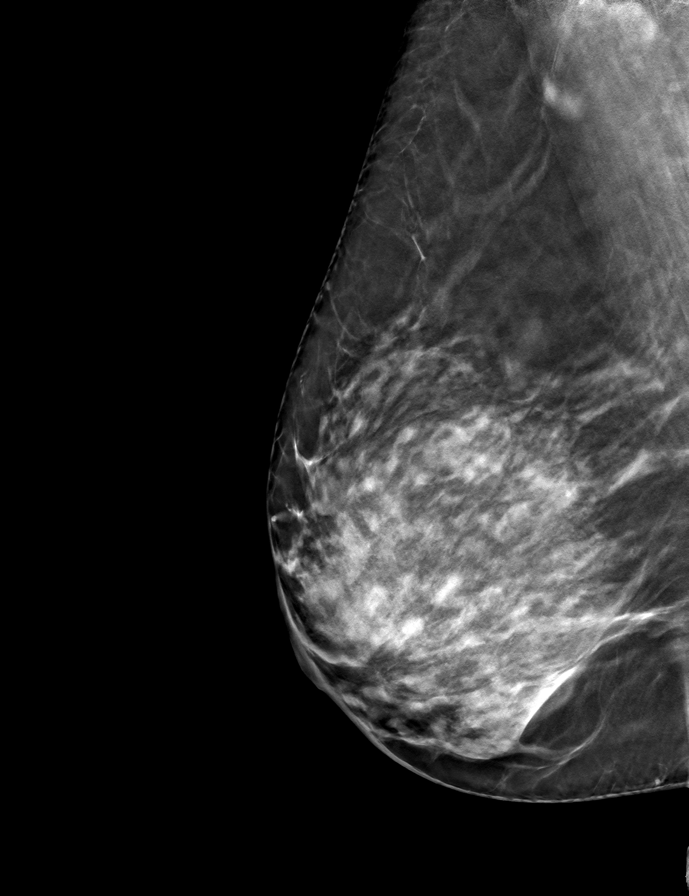

[9 of 24 positions shown; findings below may reference images not displayed]

ACR Breast Density Category d: The breast tissue is extremely dense,
which lowers the sensitivity of mammography
FINDINGS: There are no findings suspicious for malignancy. Images were
processed with CAD.
IMPRESSION: No mammographic evidence of malignancy. A result letter of this
screening mammogram will be mailed directly to the patient.

RECOMMENDATION:
Screening mammogram in one year. (Code:WO-0-ZI0)

BI-RADS CATEGORY  1: Negative.

## 2020-05-19 DIAGNOSIS — H40003 Preglaucoma, unspecified, bilateral: Secondary | ICD-10-CM | POA: Diagnosis not present

## 2020-05-19 DIAGNOSIS — H2513 Age-related nuclear cataract, bilateral: Secondary | ICD-10-CM | POA: Diagnosis not present

## 2020-08-28 DIAGNOSIS — J029 Acute pharyngitis, unspecified: Secondary | ICD-10-CM | POA: Diagnosis not present

## 2020-09-30 DIAGNOSIS — Z124 Encounter for screening for malignant neoplasm of cervix: Secondary | ICD-10-CM | POA: Diagnosis not present

## 2020-09-30 DIAGNOSIS — Z6823 Body mass index (BMI) 23.0-23.9, adult: Secondary | ICD-10-CM | POA: Diagnosis not present

## 2020-09-30 DIAGNOSIS — Z01419 Encounter for gynecological examination (general) (routine) without abnormal findings: Secondary | ICD-10-CM | POA: Diagnosis not present

## 2020-11-16 DIAGNOSIS — H40003 Preglaucoma, unspecified, bilateral: Secondary | ICD-10-CM | POA: Diagnosis not present

## 2020-11-16 DIAGNOSIS — H2513 Age-related nuclear cataract, bilateral: Secondary | ICD-10-CM | POA: Diagnosis not present

## 2020-11-24 DIAGNOSIS — R319 Hematuria, unspecified: Secondary | ICD-10-CM | POA: Diagnosis not present

## 2020-12-07 DIAGNOSIS — Z1231 Encounter for screening mammogram for malignant neoplasm of breast: Secondary | ICD-10-CM | POA: Diagnosis not present

## 2020-12-09 DIAGNOSIS — N390 Urinary tract infection, site not specified: Secondary | ICD-10-CM | POA: Diagnosis not present
